# Patient Record
Sex: Female | Born: 1990 | Race: Black or African American | Hispanic: No | Marital: Single | Smoking: Never smoker
Health system: Southern US, Community
[De-identification: ages and names within clinical notes are randomized; demographics above are authoritative.]

## PROBLEM LIST (undated history)

## (undated) DIAGNOSIS — E079 Disorder of thyroid, unspecified: Secondary | ICD-10-CM

## (undated) DIAGNOSIS — F32A Depression, unspecified: Secondary | ICD-10-CM

## (undated) DIAGNOSIS — F419 Anxiety disorder, unspecified: Secondary | ICD-10-CM

## (undated) DIAGNOSIS — F1910 Other psychoactive substance abuse, uncomplicated: Secondary | ICD-10-CM

## (undated) DIAGNOSIS — F988 Other specified behavioral and emotional disorders with onset usually occurring in childhood and adolescence: Secondary | ICD-10-CM

## (undated) HISTORY — DX: Other specified behavioral and emotional disorders with onset usually occurring in childhood and adolescence: F98.8

## (undated) HISTORY — DX: Anxiety disorder, unspecified: F41.9

## (undated) HISTORY — DX: Disorder of thyroid, unspecified: E07.9

## (undated) HISTORY — DX: Depression, unspecified: F32.A

## (undated) HISTORY — DX: Other psychoactive substance abuse, uncomplicated: F19.10

## (undated) HISTORY — PX: NO PRIOR SURGERIES: 100

## (undated) MED ORDER — LEVOTHYROXINE SODIUM 75 MCG OR TABS
75.0000 ug | ORAL_TABLET | Freq: Every day | ORAL | 1 refills | Status: AC
Start: 2023-02-05 — End: ?

## (undated) MED ORDER — LEVOTHYROXINE SODIUM 50 MCG OR TABS
ORAL_TABLET | ORAL | 8 refills | Status: AC
Start: 2020-02-06 — End: ?

## (undated) MED ORDER — LEVOTHYROXINE SODIUM 50 MCG OR TABS
ORAL_TABLET | ORAL | 8 refills | Status: AC
Start: 2020-02-07 — End: ?

---

## 2008-04-02 ENCOUNTER — Other Ambulatory Visit: Admission: RE | Admit: 2008-04-02 | Discharge: 2008-04-02 | Payer: Self-pay | Admitting: Gynecology

## 2008-04-02 ENCOUNTER — Encounter: Payer: Self-pay | Admitting: Gynecology

## 2008-04-02 ENCOUNTER — Ambulatory Visit: Payer: Self-pay | Admitting: Gynecology

## 2009-09-05 ENCOUNTER — Emergency Department (HOSPITAL_COMMUNITY): Admission: EM | Admit: 2009-09-05 | Discharge: 2009-09-05 | Payer: Self-pay | Admitting: Emergency Medicine

## 2009-09-21 ENCOUNTER — Emergency Department (HOSPITAL_COMMUNITY): Admission: EM | Admit: 2009-09-21 | Discharge: 2009-09-22 | Payer: Self-pay | Admitting: Emergency Medicine

## 2009-09-27 ENCOUNTER — Emergency Department (HOSPITAL_COMMUNITY): Admission: EM | Admit: 2009-09-27 | Discharge: 2009-09-28 | Payer: Self-pay | Admitting: Emergency Medicine

## 2009-11-06 ENCOUNTER — Ambulatory Visit (HOSPITAL_COMMUNITY): Admission: RE | Admit: 2009-11-06 | Discharge: 2009-11-06 | Payer: Self-pay | Admitting: Obstetrics & Gynecology

## 2009-11-19 ENCOUNTER — Inpatient Hospital Stay (HOSPITAL_COMMUNITY): Admission: RE | Admit: 2009-11-19 | Discharge: 2009-11-20 | Payer: Self-pay | Admitting: Obstetrics & Gynecology

## 2009-11-20 ENCOUNTER — Encounter (INDEPENDENT_AMBULATORY_CARE_PROVIDER_SITE_OTHER): Payer: Self-pay | Admitting: Obstetrics & Gynecology

## 2009-11-24 ENCOUNTER — Ambulatory Visit: Payer: Self-pay | Admitting: Obstetrics and Gynecology

## 2009-11-24 ENCOUNTER — Encounter (INDEPENDENT_AMBULATORY_CARE_PROVIDER_SITE_OTHER): Payer: Self-pay | Admitting: Obstetrics and Gynecology

## 2009-11-24 ENCOUNTER — Inpatient Hospital Stay (HOSPITAL_COMMUNITY)
Admission: AD | Admit: 2009-11-24 | Discharge: 2009-11-25 | Payer: Self-pay | Source: Home / Self Care | Admitting: Obstetrics and Gynecology

## 2009-11-26 ENCOUNTER — Inpatient Hospital Stay (HOSPITAL_COMMUNITY)
Admission: AD | Admit: 2009-11-26 | Discharge: 2009-11-26 | Payer: Self-pay | Source: Home / Self Care | Admitting: Obstetrics & Gynecology

## 2009-11-26 DIAGNOSIS — N938 Other specified abnormal uterine and vaginal bleeding: Secondary | ICD-10-CM

## 2009-11-26 DIAGNOSIS — N949 Unspecified condition associated with female genital organs and menstrual cycle: Secondary | ICD-10-CM

## 2009-11-30 DEATH — deceased

## 2010-05-09 NOTE — Discharge Summary (Signed)
  NAMEVIVIA, ROSENBURG                ACCOUNT NO.:  [id number]  MEDICAL RECORD NO.:  [id number]          PATIENT TYPE:  INP  LOCATION:  9305                          FACILITY:  [facility]  PHYSICIAN:  Malva Limes, M.D.    DATE OF BIRTH:  1990/03/14  DATE OF ADMISSION:  11/24/2009 DATE OF DISCHARGE:  11/25/2009                              DISCHARGE SUMMARY   PRINCIPAL DISCHARGE DIAGNOSIS:  Abdominal pain.  HISTORY OF PRESENT ILLNESS:  Ms. Melyssa is a 20 year old female G1, P0-0- 1-0, status post elective pregnancy termination for a hydramnios 4 days prior to admission.  The patient presented to emergency room complaining of abdominal pain and severe abdominal cramping and bleeding.  The patient was felt to possibly have retained products of conception; therefore, she was taken to the operating room where D and C was performed.  The pathology revealed no evidence of retained products of conception.  The patient's white count on admission was 16. Postoperative day #1, the patient was doing well.  The white count had decreased to 12.  She had no complaint.  She was discharged to home. She was given Keflex 500 mg q.i.d. for 7 days.  She was told to return to the office in 1 week.          ______________________________ Malva Limes, M.D.     MA/MEDQ  D:  05/05/2010  T:  05/06/2010  Job:  [id number]  Electronically Signed by Malva Limes M.D. on 05/08/2010 09:45:16 AM

## 2010-05-15 LAB — CBC
HCT: 36.1 % (ref 36.0–46.0)
HCT: 36.9 % (ref 36.0–46.0)
Hemoglobin: 12.1 g/dL (ref 12.0–15.0)
MCH: 29.8 pg (ref 26.0–34.0)
MCH: 30 pg (ref 26.0–34.0)
MCH: 30.2 pg (ref 26.0–34.0)
MCHC: 33.3 g/dL (ref 30.0–36.0)
MCV: 88.5 fL (ref 78.0–100.0)
MCV: 89.2 fL (ref 78.0–100.0)
Platelets: 168 10*3/uL (ref 150–400)
Platelets: 185 10*3/uL (ref 150–400)
Platelets: 216 10*3/uL (ref 150–400)
Platelets: 241 10*3/uL (ref 150–400)
RBC: 3.86 MIL/uL — ABNORMAL LOW (ref 3.87–5.11)
RBC: 4.02 MIL/uL (ref 3.87–5.11)
RDW: 13 % (ref 11.5–15.5)
WBC: 11 10*3/uL — ABNORMAL HIGH (ref 4.0–10.5)
WBC: 12.3 10*3/uL — ABNORMAL HIGH (ref 4.0–10.5)
WBC: 16.1 10*3/uL — ABNORMAL HIGH (ref 4.0–10.5)

## 2010-05-15 LAB — DIFFERENTIAL
Basophils Relative: 0 % (ref 0–1)
Monocytes Absolute: 0.4 10*3/uL (ref 0.1–1.0)
Neutro Abs: 8.4 10*3/uL — ABNORMAL HIGH (ref 1.7–7.7)
Neutrophils Relative %: 76 % (ref 43–77)

## 2010-05-15 LAB — RPR: RPR Ser Ql: NONREACTIVE

## 2010-05-17 LAB — COMPREHENSIVE METABOLIC PANEL
AST: 12 U/L (ref 0–37)
AST: 15 U/L (ref 0–37)
Alkaline Phosphatase: 39 U/L (ref 39–117)
Calcium: 9.4 mg/dL (ref 8.4–10.5)
Chloride: 105 mEq/L (ref 96–112)
Creatinine, Ser: 0.67 mg/dL (ref 0.4–1.2)
Creatinine, Ser: 0.67 mg/dL (ref 0.4–1.2)
GFR calc non Af Amer: >60 mL/min (ref >60)
Sodium: 135 mEq/L (ref 135–145)
Total Protein: 7.3 g/dL (ref 6.0–8.3)
Total Protein: 7.4 g/dL (ref 6.0–8.3)

## 2010-05-17 LAB — URINALYSIS, ROUTINE W REFLEX MICROSCOPIC
Bilirubin Urine: NEGATIVE
Glucose, UA: NEGATIVE mg/dL
Glucose, UA: NEGATIVE mg/dL
Hgb urine dipstick: NEGATIVE
Ketones, ur: NEGATIVE mg/dL
Nitrite: NEGATIVE
Nitrite: NEGATIVE
Protein, ur: NEGATIVE mg/dL
Specific Gravity, Urine: 1.012 (ref 1.005–1.030)
Urobilinogen, UA: 0.2 mg/dL (ref 0.0–1.0)
pH: 6 (ref 5.0–8.0)

## 2010-05-17 LAB — CBC
HCT: 35.3 % — ABNORMAL LOW (ref 36.0–46.0)
MCHC: 33.7 g/dL (ref 30.0–36.0)
MCHC: 34.5 g/dL (ref 30.0–36.0)
MCV: 86.1 fL (ref 78.0–100.0)
Platelets: 216 10*3/uL (ref 150–400)
RBC: 4.22 MIL/uL (ref 3.87–5.11)
RDW: 14.6 % (ref 11.5–15.5)
WBC: 15.2 10*3/uL — ABNORMAL HIGH (ref 4.0–10.5)

## 2010-05-17 LAB — DIFFERENTIAL
Basophils Relative: 0 % (ref 0–1)
Basophils Relative: 0 % (ref 0–1)
Eosinophils Absolute: 0.1 10*3/uL (ref 0.0–0.7)
Eosinophils Absolute: 0 10*3/uL (ref 0.0–0.7)
Lymphocytes Relative: 15 % (ref 12–46)
Lymphs Abs: 2.1 10*3/uL (ref 0.7–4.0)
Lymphs Abs: 2.4 10*3/uL (ref 0.7–4.0)
Monocytes Absolute: 0.6 10*3/uL (ref 0.1–1.0)
Monocytes Relative: 5 % (ref 3–12)
Monocytes Relative: 4 % (ref 3–12)
Neutro Abs: 12.1 10*3/uL — ABNORMAL HIGH (ref 1.7–7.7)

## 2010-05-17 LAB — LIPASE, BLOOD
Lipase: 26 U/L (ref 11–59)
Lipase: 31 U/L (ref 11–59)

## 2010-05-17 LAB — HCG, QUANTITATIVE, PREGNANCY: hCG, Beta Chain, Quant, S: 49914 m[IU]/mL — ABNORMAL HIGH (ref <5)

## 2010-05-18 LAB — URINALYSIS, ROUTINE W REFLEX MICROSCOPIC
Hgb urine dipstick: NEGATIVE
Ketones, ur: 15 mg/dL — AB
Urobilinogen, UA: 1 mg/dL (ref 0.0–1.0)

## 2010-05-18 LAB — URINE MICROSCOPIC-ADD ON

## 2011-05-02 IMAGING — US US ABDOMEN COMPLETE
1 series · 14 of 25 positions shown · non-contrast
Comparison: None

CLINICAL DATA: Abdominal pain and vomiting.

ABDOMINAL ULTRASOUND COMPLETE

[Series 1: us abdomen complete · 0.32mm/px · 14 of 45 slices shown]
[im 1/45]
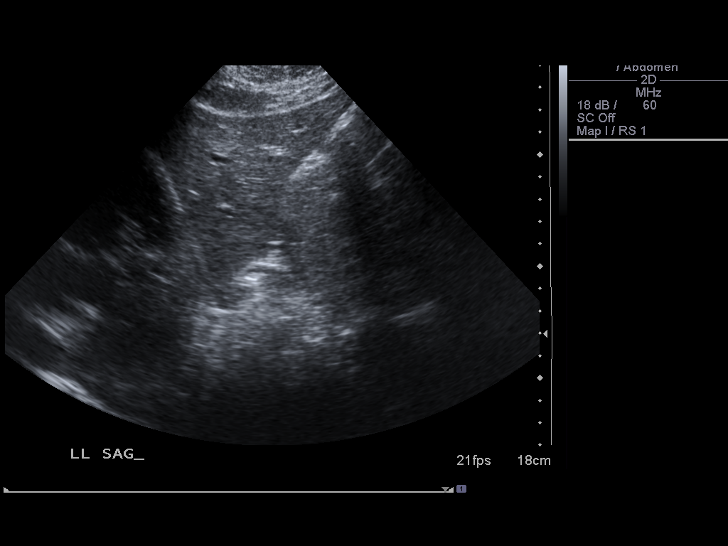
[im 4/45]
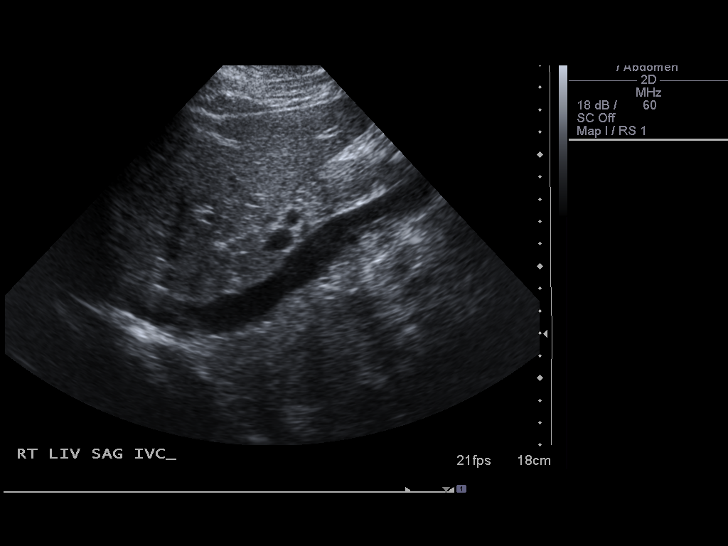
[im 8/45]
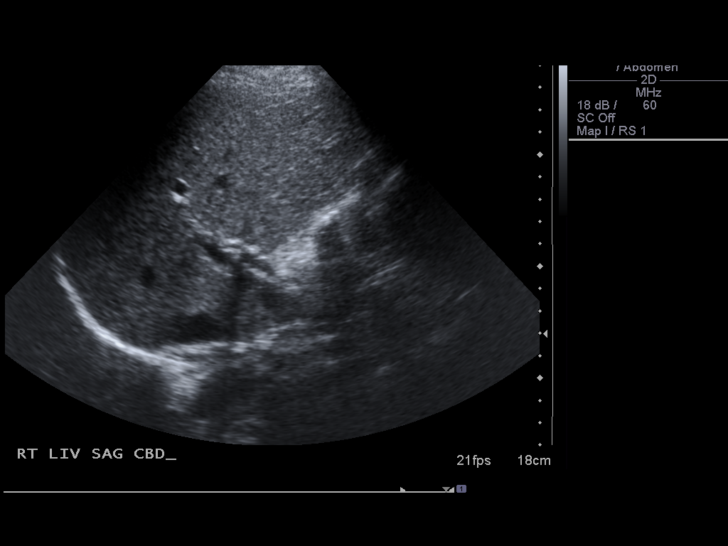
[im 12/45]
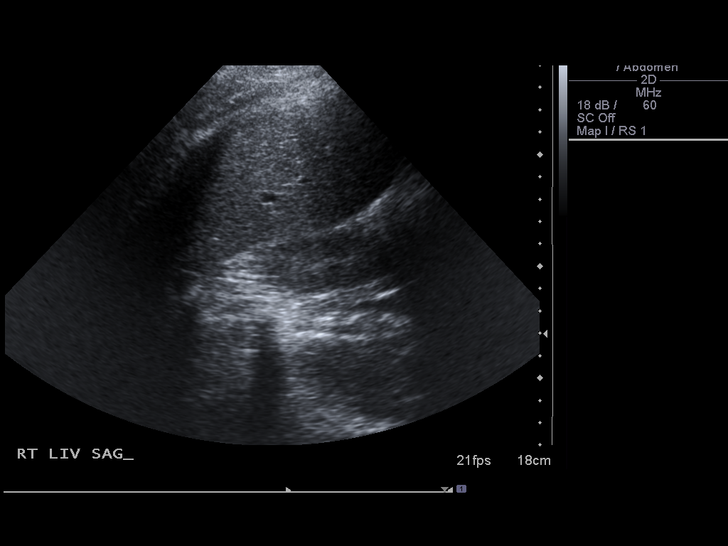
[im 15/45]
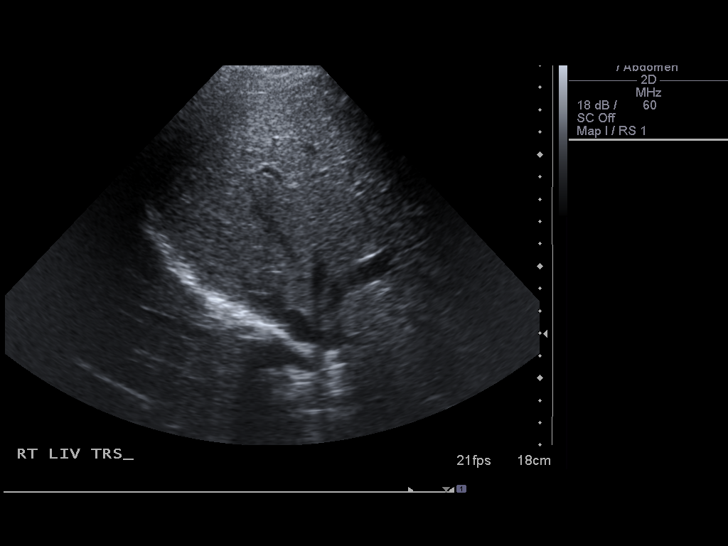
[im 17/45]
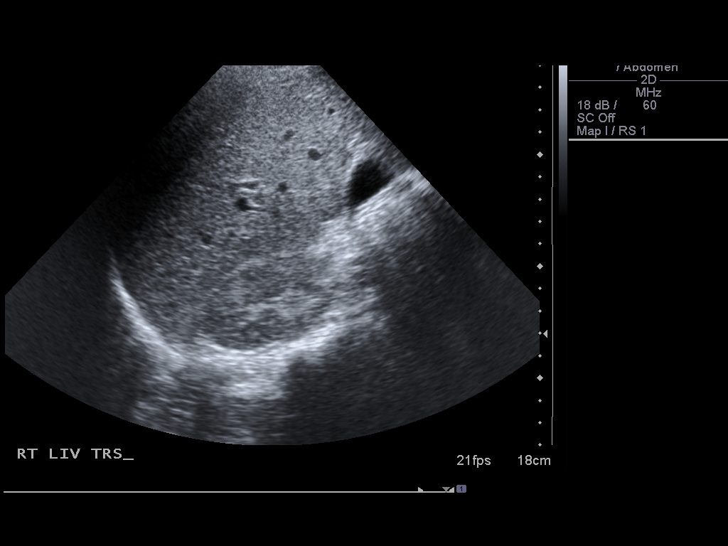
[im 21/45]
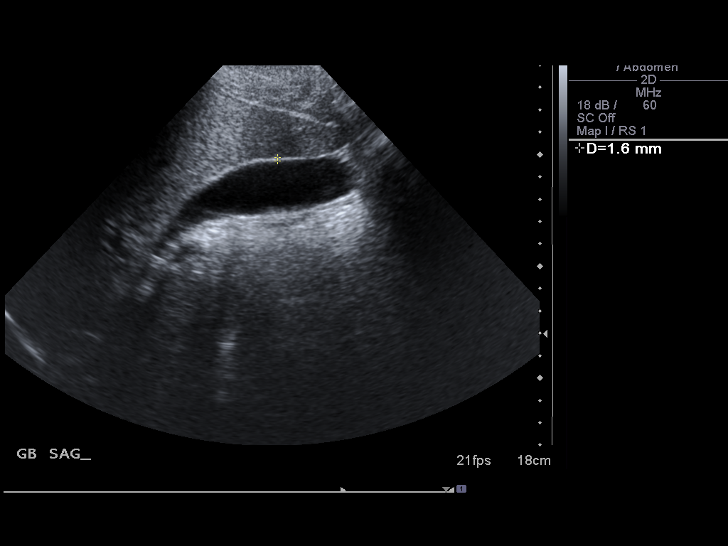
[im 24/45]
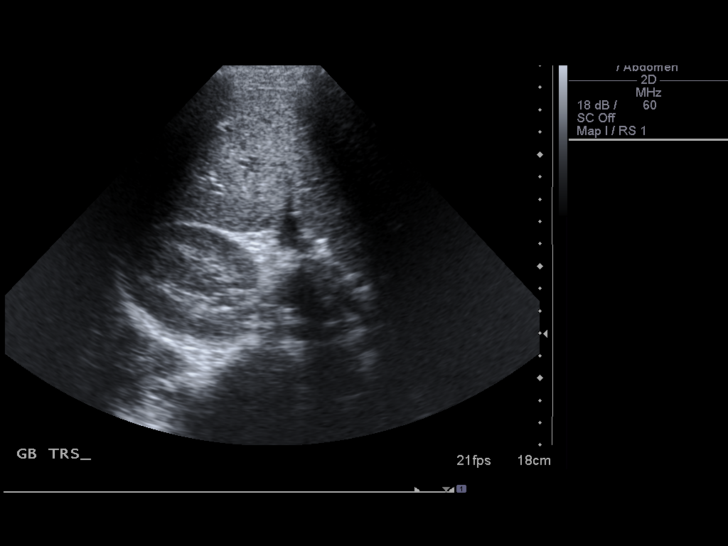
[im 28/45]
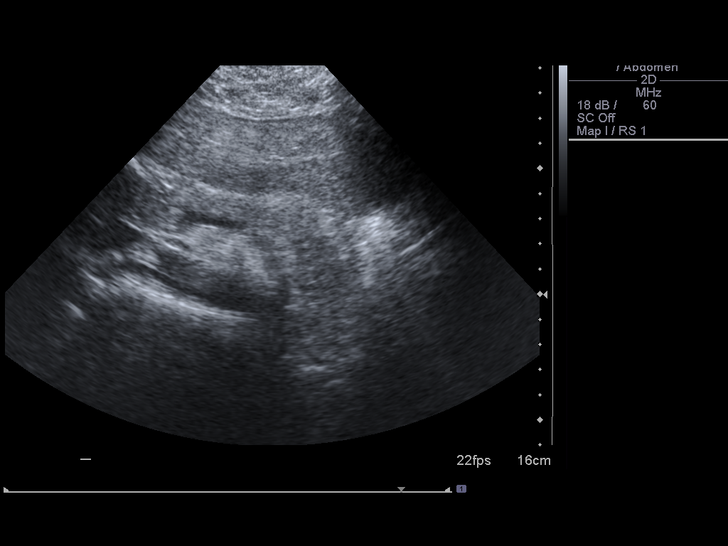
[im 30/45]
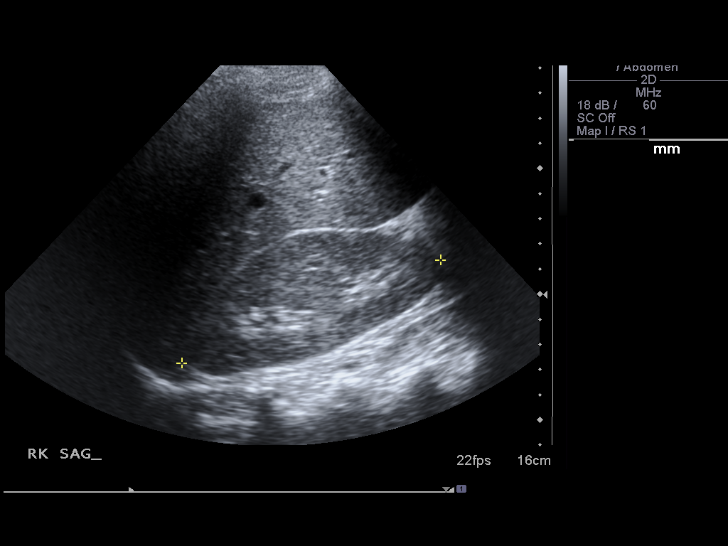
[im 34/45]
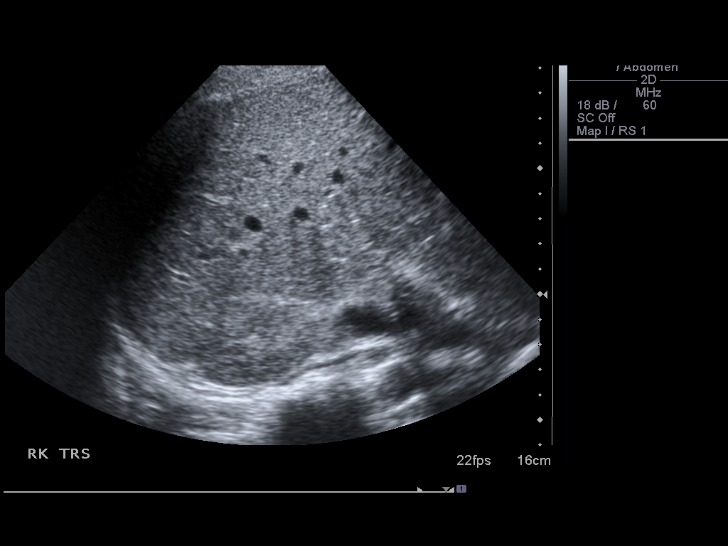
[im 37/45]
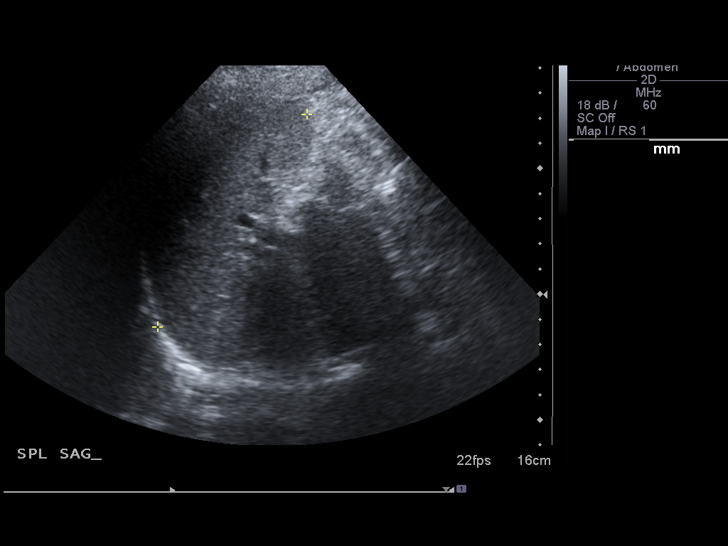
[im 41/45]
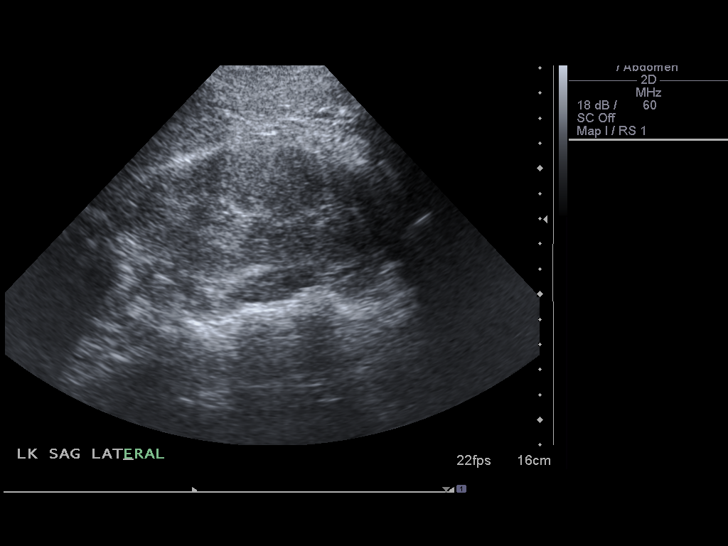
[im 45/45]
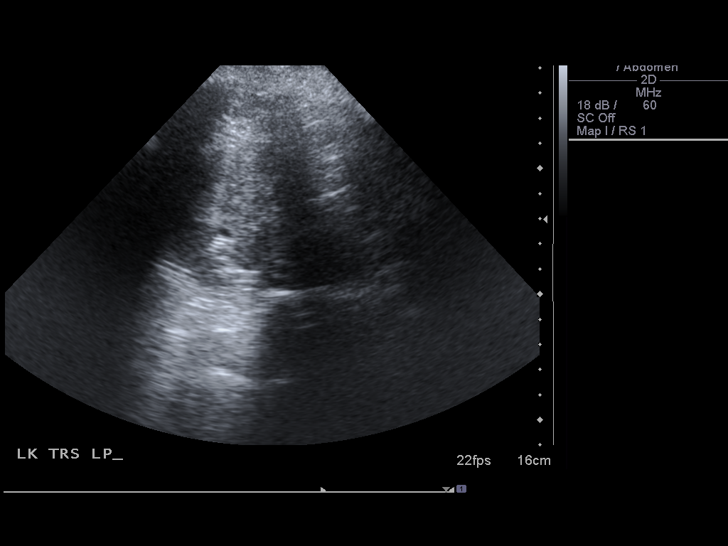

[14 of 25 positions shown; findings below may reference images not displayed]

FINDINGS: Gallbladder:  The gallbladder is normal in appearance, without
evidence for gallstones, gallbladder wall thickening or
pericholecystic fluid. No ultrasonographic Murphy's sign is
elicited.

Common Bile Duct:  0.5 cm in diameter; within normal limits in
caliber.

Liver:  Normal parenchymal echogenicity and echotexture; no focal
lesions identified.  Limited Doppler evaluation demonstrates normal
blood flow within the liver.

IVC:  Unremarkable in appearance.

Pancreas:  Although the pancreas is difficult to visualize in its
entirety due to overlying bowel gas, no focal pancreatic
abnormality is identified.

Spleen:  10.3 cm in length; within normal limits in size and
echotexture.

Right kidney:  11.1 cm in length; normal in size, configuration and
parenchymal echogenicity.  No evidence of mass or hydronephrosis.

Left kidney:  11.9 cm in length; normal in size, configuration and
parenchymal echogenicity.  No evidence of mass or hydronephrosis.

Abdominal Aorta:  Normal in caliber; no aneurysm identified.
IMPRESSION: Unremarkable abdominal ultrasound.

## 2012-09-14 ENCOUNTER — Ambulatory Visit: Payer: Self-pay | Admitting: *Deleted

## 2017-09-15 ENCOUNTER — Ambulatory Visit (INDEPENDENT_AMBULATORY_CARE_PROVIDER_SITE_OTHER): Payer: PRIVATE HEALTH INSURANCE | Admitting: Family Medicine

## 2017-09-15 ENCOUNTER — Encounter (INDEPENDENT_AMBULATORY_CARE_PROVIDER_SITE_OTHER): Payer: Self-pay | Admitting: Family Medicine

## 2017-09-15 VITALS — BP 121/70 | HR 66 | Temp 99.5°F | Ht 60.24 in | Wt 140.2 lb

## 2017-09-15 DIAGNOSIS — E063 Autoimmune thyroiditis: Secondary | ICD-10-CM

## 2017-09-15 DIAGNOSIS — E038 Other specified hypothyroidism: Secondary | ICD-10-CM

## 2017-09-15 DIAGNOSIS — F3289 Other specified depressive episodes: Secondary | ICD-10-CM

## 2017-09-15 DIAGNOSIS — F900 Attention-deficit hyperactivity disorder, predominantly inattentive type: Secondary | ICD-10-CM

## 2017-09-15 DIAGNOSIS — J358 Other chronic diseases of tonsils and adenoids: Secondary | ICD-10-CM

## 2017-09-15 DIAGNOSIS — Z6827 Body mass index (BMI) 27.0-27.9, adult: Secondary | ICD-10-CM

## 2017-09-15 DIAGNOSIS — R197 Diarrhea, unspecified: Secondary | ICD-10-CM

## 2017-09-15 NOTE — Progress Notes (Addendum)
CC:  Chief Complaint   Patient presents with    Establish Care     welcome to [facility], just moved in 11/19  needs PCP    Referral     endocrinology Dr. Jacqualine Mauylee, Psych       HPI  Meagan Coffey is a 27 year old female who presents to the office today to establish care.  She recently moved to [place] from [place] here 11/18.    -she was diagnosed with Hypothyroidism last year while in [place].  She is not sure if her levels are stable-feels hot flushes at times.  Considering a endocrine evaluation.  TPO antibiotic have been very elevated in the past.      -diagnosed with ADD about 2 years ago.  She has been taking concerta ever since.  She wa having trouble with concentration and task managements.  She wonders if perhaps she was affected by her thyroid issues.  She is not taking it as much any more.  Feels much better since taking Cymbalta.  Would like to have a formal evaluation.  Loosing things and disorganized.  Doing much better    -Patient started Cymbalta and was started a few months ago.  Was evaluated by a physician in [place] and was told that maybe there was a component of depression as well.  She is taking it and is helping.  Denies suicidal.  Would like to continue the medication for now.    -tonsil stones: for about 3 years.  Doing irrigations but recurrent and has recurrent infections  ENT told her before that she could have surgery for this and she is interested.    -diarrhea, loose stool with morning BM. No abd pain and no blood in the stool.  Since about last august.  Always loose in the morning.  Concerned about gluten intolerance.  She is better when in a gluten free diet and in a low FODMAP diet.      Past Medical History  No past medical history on file.    Past Surgical History  No past surgical history on file.    Allergies  Review of patient's allergies indicates:  Allergies   Allergen Reactions    Penicillins Fever, Rash and Diarrhea/GI       Medications  Current Outpatient Medications   Medication  Sig Dispense Refill    DULoxetine (CYMBALTA) 20 MG Oral CAPSULE ENTERIC COATED PARTICLES Take 20 mg by mouth daily.      Levothyroxine Sodium 125 MCG Oral Cap Take 125 mcg by mouth daily on an empty stomach.      methylphenidate ER (CONCERTA) 36 MG Oral Tab CR Take 36 mg by mouth.       No current facility-administered medications for this visit.        Family History  Family History     Problem (# of Occurrences) Relation (Name,Age of Onset)    Cancer (1) Mother    Depression (1) Sister    Lung Cancer (1) Mother (5644)    Lupus (1) Maternal Aunt            Social History     Tobacco Use    Smoking status: Former Smoker     Last attempt to quit: 10/16/2013     Years since quitting: 3.9   Substance Use Topics    Alcohol use: Yes     Frequency: Monthly or less     Comment: occ    Drug use: Not on file  ROS:  Constitutional: Denies fatigue or WT loss  ENT: as above  CARDIOVASCULAR:Denies chest pain, shortness of breath, sometime palpitations, no orthopnea, no syncopal episodes, no dizziness or bad headaches  RESPIRATORY: Denies SHORTNESS OF BREATH, no wheezing, no chronic cough, no pleuritic chest pain  GI:  diarrhea per HPI. No abdominal pain.  GU: no dysuria, no polyuria, no hematuria, no tenesmus  SKIN: no new or worsening rash  Neuro:no weakness, no numbness, no HA, confusion, no visual changes.      Physical Exam  BP 121/70    Pulse 66    Temp 99.5 F (37.5 C) (Temporal)    Ht 5' 0.24" (1.53 m)    Wt 140 lb 3.2 oz (63.6 kg)    LMP 09/12/2017    SpO2 98%    BMI 27.17 kg/m   PHYSICAL EXAM:  General: healthy, alert, relaxed, cooperative, smiling  Skin: Skin color, texture, turgor normal. No rashes or concerning lesions  Head: Normocephalic. No masses, lesions, tenderness or abnormalities  Ears: External ears normal. Canals clear. TM's normal.  Nose:normal, nares patent  Oropharynx: Lips, mucosa, and tongue normal. Teeth and gums normal., posterior pharynx without erythema or drainage, there are large  criptae on residual tonsilar tissue, R>L  Neck: supple. No adenopathy. Thyroid symmetric, normal size, without nodules  Lungs: clear to auscultation  Heart: normal rate, regular rhythm and no murmurs, clicks, or gallops  Psych: normal and congruent mood and affect, normal speech and behavior, good judgement and insight    Assessment/Plan    (E03.8,  E06.3) Hypothyroidism due to Hashimoto's thyroiditis  (primary encounter diagnosis)  Plan: Thyroid Stimulating Hormone, T3, Free T4      Let's check levels and adjust the medication as needed.  Consider endocrine referral if needed.    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   will get ROI signed and review records.    Diagnosis and treatment options have been explained to the patient including medications side effects.  Questions have been addressed.  Patient verbalized understanding and agrees with the plan outlined today. Patient will return to medical attention if symptoms persist or worsen. Patient advised to read the information that will be provided by his pharmacist and to call if questions.  AVS was reviewed with patient.     Elsie Saas, MD  [facility]  [facility]    Notes on Charting and calculation of WUJ:WJXBJYN of coordination of care may appear as preceding or following telephone encounters, or notes in order details sections of orders, or in notes attached to labs ordered incident to this visit.

## 2017-09-15 NOTE — Patient Instructions (Signed)
Meagan Coffey  It was a pleasure to meet you today. Thank you for choosing [facility] at [facility].    If lab tests were ordered today, we will send you a message as the results arrive.  If you have any concerns or questions after the visit or when reviewing your results, please do not hesitate to send Meagan Coffey an e-message or give Meagan Coffey a call.    If medications were prescribed, please read the information your pharmacist will provide you about the medication, its indications and potential side effects.  Please ask any questions you may have before taking the medication.       Please make the appointment with the specialist as discussed.  Come to the lab tomorrow or next week while eating gluten.  Schedule an annual exam when you are able.

## 2017-09-16 ENCOUNTER — Encounter (INDEPENDENT_AMBULATORY_CARE_PROVIDER_SITE_OTHER): Payer: Self-pay | Admitting: Family Medicine

## 2017-09-16 NOTE — Telephone Encounter (Signed)
ROI Received on: 09/15/2017     [facility]    Records needed: Complete coy of legal    Request was sent via: Fax; faxed to [phone]    Contact number for follow up:

## 2017-09-16 NOTE — Telephone Encounter (Signed)
ROI Received on: 09/15/2017     [facility]    Records needed: Hashimoto's // labs + notes    Request was sent via: Fax; faxed to [phone]    Contact number for follow up: [phone]

## 2017-09-20 ENCOUNTER — Other Ambulatory Visit (INDEPENDENT_AMBULATORY_CARE_PROVIDER_SITE_OTHER): Payer: Self-pay | Admitting: Family Medicine

## 2017-09-20 DIAGNOSIS — F900 Attention-deficit hyperactivity disorder, predominantly inattentive type: Secondary | ICD-10-CM

## 2017-09-20 DIAGNOSIS — R197 Diarrhea, unspecified: Secondary | ICD-10-CM

## 2017-09-20 LAB — T4, FREE: Thyroxine (Free): 0.9 ng/dL (ref 0.6–1.2)

## 2017-09-20 LAB — BASIC METABOLIC PANEL
Anion Gap: 9 (ref 4–12)
Calcium: 9.8 mg/dL (ref 8.9–10.2)
Carbon Dioxide, Total: 28 meq/L (ref 22–32)
Chloride: 103 meq/L (ref 98–108)
Creatinine: 0.52 mg/dL (ref 0.38–1.02)
GFR, Calc, African American: >60 mL/min/{1.73_m2} (ref >59)
GFR, Calc, European American: >60 mL/min/{1.73_m2} (ref >59)
Glucose: 78 mg/dL (ref 62–125)
Potassium: 4.4 meq/L (ref 3.6–5.2)
Sodium: 140 meq/L (ref 135–145)
Urea Nitrogen: 6 mg/dL — ABNORMAL LOW (ref 8–21)

## 2017-09-20 LAB — THYROID STIMULATING HORMONE: Thyroid Stimulating Hormone: 2.141 u[IU]/mL (ref 0.400–5.000)

## 2017-09-20 LAB — T3: Triiodothyronine (T3): 105 ng/dL (ref 73–178)

## 2017-09-21 ENCOUNTER — Telehealth (INDEPENDENT_AMBULATORY_CARE_PROVIDER_SITE_OTHER): Payer: Self-pay

## 2017-09-21 LAB — CELIAC SEROLOGY REFLEX PANEL
Anti Deaminated Gliadin, IgG: <1 U (ref 0–13)
Anti tTransglutaminase, IgA: <1 U (ref 0–13)

## 2017-09-21 NOTE — Result Encounter Note (Signed)
Results reviewed and released to patient through ecare with a brief explanation.

## 2017-09-21 NOTE — Telephone Encounter (Signed)
Per Dr.. Henkle's request, I sent pt an ecare msg asking if she is willing to be seen at [facility].

## 2017-09-22 ENCOUNTER — Encounter (INDEPENDENT_AMBULATORY_CARE_PROVIDER_SITE_OTHER): Payer: Self-pay | Admitting: Family Medicine

## 2017-09-22 DIAGNOSIS — R1084 Generalized abdominal pain: Secondary | ICD-10-CM

## 2017-09-22 DIAGNOSIS — R197 Diarrhea, unspecified: Secondary | ICD-10-CM

## 2017-09-22 NOTE — Telephone Encounter (Signed)
Routing to provider.     Patient symptoms are still present and becoming more severe.     Patient is wondering what next step is. See a GI specialist?    Would like to get started on next step.    Please review and advise. Thank you.

## 2017-09-24 ENCOUNTER — Other Ambulatory Visit (INDEPENDENT_AMBULATORY_CARE_PROVIDER_SITE_OTHER): Payer: Self-pay | Admitting: Family Medicine

## 2017-09-24 DIAGNOSIS — F988 Other specified behavioral and emotional disorders with onset usually occurring in childhood and adolescence: Secondary | ICD-10-CM

## 2017-09-24 NOTE — Progress Notes (Signed)
HN at [facility] received a referral to psychiatry for patient to see Fonnie MuKerry Meyer for ADD/ADHD. This is not the correct referral type.    Referral needs to bo To ADD/ADHD ADULTS. Pending correct referral and routing to PCP and HN at [facility]. PCP to review/sign correct order.

## 2017-09-24 NOTE — Addendum Note (Signed)
Addended by: Karolee StampsSCORDAMAGLIA, Jamilette Suchocki MARIA on: 09/24/2017 03:55 PM     Modules accepted: Orders

## 2017-09-24 NOTE — Addendum Note (Signed)
Addended by: Karolee StampsSCORDAMAGLIA, Zadyn Yardley MARIA on: 09/24/2017 03:56 PM     Modules accepted: Orders

## 2017-09-24 NOTE — Telephone Encounter (Addendum)
Opened in error

## 2017-09-25 NOTE — Telephone Encounter (Addendum)
Yes, please.  I would like to review these records.

## 2017-09-25 NOTE — Telephone Encounter (Signed)
Records received on 09/25/2017 from [facility]. Records are being held in the medical records drawer.    Dr. Gerilyn NestleHenkle - Please let PSR know, via addendum to this encounter under the contact log tab, if you would like to review these records.    Patient has an appointment on: 09/29/2017    Data entered/Scanned records:  SCANNED: Pap: NO           Mammo: NO                      Colonoscopy: NO           Diabetic Eye Exam: NO  DATA ENTERED (by lab):            INR: NO                      LDL: YES                      A1C: YES  IMMUNIZATIONS: No Immunizations in records    Records to be shredded: 03/02/2018

## 2017-09-25 NOTE — Progress Notes (Signed)
Done.  Thank you very much for your assistance.  EH

## 2017-09-29 ENCOUNTER — Ambulatory Visit (INDEPENDENT_AMBULATORY_CARE_PROVIDER_SITE_OTHER): Payer: PRIVATE HEALTH INSURANCE | Admitting: Family Medicine

## 2017-09-29 ENCOUNTER — Encounter (INDEPENDENT_AMBULATORY_CARE_PROVIDER_SITE_OTHER): Payer: Self-pay | Admitting: Family Medicine

## 2017-09-29 VITALS — BP 93/62 | HR 77 | Temp 99.2°F | Ht 61.02 in | Wt 140.0 lb

## 2017-09-29 DIAGNOSIS — Z6826 Body mass index (BMI) 26.0-26.9, adult: Secondary | ICD-10-CM

## 2017-09-29 DIAGNOSIS — K591 Functional diarrhea: Secondary | ICD-10-CM

## 2017-09-29 DIAGNOSIS — E038 Other specified hypothyroidism: Secondary | ICD-10-CM

## 2017-09-29 DIAGNOSIS — L659 Nonscarring hair loss, unspecified: Secondary | ICD-10-CM

## 2017-09-29 DIAGNOSIS — E063 Autoimmune thyroiditis: Secondary | ICD-10-CM

## 2017-09-29 DIAGNOSIS — Z0000 Encounter for general adult medical examination without abnormal findings: Secondary | ICD-10-CM

## 2017-09-29 LAB — CBC, DIFF
% Basophils: 0 %
% Eosinophils: 3 %
% Immature Granulocytes: 0 %
% Lymphocytes: 33 %
% Monocytes: 6 %
% Neutrophils: 58 %
% Nucleated RBC: 0 %
Absolute Eosinophil Count: 0.16 10*3/uL (ref 0.00–0.50)
Absolute Lymphocyte Count: 1.64 10*3/uL (ref 1.00–4.80)
Basophils: 0.02 10*3/uL (ref 0.00–0.20)
Hematocrit: 43 % (ref 36–45)
Hemoglobin: 13.8 g/dL (ref 11.5–15.5)
Immature Granulocytes: 0 10*3/uL (ref 0.00–0.05)
MCH: 28.7 pg (ref 27.3–33.6)
MCHC: 32.1 g/dL — ABNORMAL LOW (ref 32.2–36.5)
MCV: 89 fL (ref 81–98)
Monocytes: 0.3 10*3/uL (ref 0.00–0.80)
Neutrophils: 2.87 10*3/uL (ref 1.80–7.00)
Nucleated RBC: 0 10*3/uL
Platelet Count: 267 10*3/uL (ref 150–400)
RBC: 4.81 10*6/uL (ref 3.80–5.00)
RDW-CV: 13.1 % (ref 11.6–14.4)
WBC: 4.99 10*3/uL (ref 4.3–10.0)

## 2017-09-29 LAB — HEMOGLOBIN A1C, RAPID: Hemoglobin A1C: 5.1 % (ref 4.0–6.0)

## 2017-09-29 LAB — LIPID PANEL
Cholesterol (LDL): 120 mg/dL (ref <130)
Cholesterol/HDL Ratio: 2.9
HDL Cholesterol: 70 mg/dL (ref >39)
Non-HDL Cholesterol: 134 mg/dL (ref 0–159)
Total Cholesterol: 204 mg/dL — ABNORMAL HIGH (ref <200)
Triglyceride: 69 mg/dL (ref <150)

## 2017-09-29 LAB — GLUCOSE, FASTING: Glucose, Fasting: 82 mg/dL (ref 62–125)

## 2017-09-29 MED ORDER — LEVOTHYROXINE SODIUM 50 MCG OR TABS
50.0000 ug | ORAL_TABLET | Freq: Every day | ORAL | 1 refills | Status: DC
Start: 2017-09-29 — End: 2017-12-31

## 2017-09-29 NOTE — Progress Notes (Signed)
Meagan Coffey is a 27 year old female here today for a preventive health visit.   Other problems or concerns today:     -continues to have loose stools on and off. No blood and no mucus.  Her lower abd discomfort is better after BM.    -hair thinning and hair loss-not sure why but started after taking Levothyroxine. Recent levels reviewed with Patient and normal.  I suggested to follow up on TPO antibiotic since it has been a year    -Patient is not due for pap and declines breast exam today    PHQ2 Total Score: 0      GYN HISTORY  OB History   No data available       Currently having periods: YES  Patient's last menstrual period was 09/12/2017.   Periods are irregular, lasting 5 days.    Crampy, lower abdominal pain during periods: mild, occurring first 1-2 days of flow  Cyclic symptoms: none  Intermenstrual bleeding, spotting,or discharge: No   Last pap: 2018  Pap history: no history of abnormal paps  Other gyn history: none    SEXUAL HISTORY  Sexual activity: yes, single partner, contraception - IUD  Age at first intercourse: Not asked today  History of STDs: none known  Number of sex partners in the past year: one  Last STD check: N/A  New partner(s) since last STD check: No    Sexual concerns: No    History of sexual or physical abuse: No  History of any other forms of abuse (e.g. verbal, financial): No  Has the patient been hit, kicked, punched, or otherwise hurt by someone within the past year: No    CANCER SCREENING  Family history of colon cancer: NO  Family history of uterine or ovarian cancer: NO  Family history of breast cancer: NO  Prior mammogram: NO  History of abnormal mammogram: NO    LIFESTYLE  Current dietary habits: healthy diet in general  well balanced  Calcium: dietary sources only  Current exercise habits: yes, engages in regular exercise  Regular seat belt use: YES  Substance use:  reports that she quit smoking about 3 years ago. She does not have any smokeless tobacco history on file.  She reports that she drinks alcohol.  Exposure to hazardous materials: No  Guns in the house: No    Review Of Systems  Regular dental exams: yes  Constitutional: Denies fatigue fever chills but has been tired  Respiratory: Denies dyspnea on exertion dyspnea at rest cough wheezing   Cardiovascular: Denies any cardiovascular problems   GI: Denies nausea vomiting   Neurologic: Denies dizziness syncope focal weakness   Psych: any psychological problems has been struggling with low stamina and discouragement but has been improved with Cymbalta.  Pending evaluation for ADD    EXAM:  BP 93/62    Pulse 77    Temp 99.2 F (37.3 C)    Ht 5' 1.02" (1.55 m)    Wt 140 lb (63.5 kg)    LMP 09/12/2017    SpO2 98%    BMI 26.43 kg/m   Body mass index is 26.43 kg/m.  General: healthy, alert, no distress  Head: Normocephalic. No masses, lesions, tenderness or abnormalities  supple. No adenopathy. Thyroid symmetric, normal size, without nodules  Lungs: CTA bilaterally  Heart: normal rate, regular rhythm  Skin: Skin color, texture, turgor normal. No rashes or concerning lesions  Abdomen: soft, slightly tender on the lower quadrants. BS normal. No  masses or organomegaly  Neuro:  Grossly normal to observation, gait normal  Breasts: not done: discussed recommendations for age, patient does BSE, patient declined  Pelvic exam: deferred    ASSESSMENT/PLAN:    (Z00.00) Encounter for general adult medical examination without abnormal findings  (primary encounter diagnosis)  Plan: CBC with Differential, Lipid Panel, HEMOGLOBIN         A1C, RAPID, Glucose, Fasting             (E03.8,  E06.3) Hypothyroidism due to Hashimoto's thyroiditis  Plan: Levothyroxine Sodium 50 MCG Oral Tab, Anti         Thyroid Peroxidase       (L65.9) Hair thinning  Plan: consider Biotin/collagen. Suspecting side effect form Levothyroxine.    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  Will add probiotics and soluble fiber.  Consider GI referral If not feeling better  Follow up in 3 month or PRN        Health Maintenance   Topic Date Due    HIV Screening  11/12/2003    Tetanus Vaccine  11/12/2007    Cervical Cancer Screening  11/11/2009    Influenza Vaccine (1) 11/30/2017    Depression Screening (PHQ-2)  09/30/2018    Pneumococcal Vaccine: Pediatrics (0-5 years) and At-Risk Patients (6-64 years)  Aged Out       Immunizations or studies due: none - up to date  Chlamydia screening offered: N/A  HIV screening offered: N/A  HPV vaccine offered: No    Preventive counseling: health maintenance, importance of rest and outdoor activities as well as staying active during the day introducing intervals of activity and avoiding sitting for long periods of time.  breast self-exam  dental care  Follow-up: 1 year    Diagnosis and treatment options have been explained to the patient including medications side effects.  Questions have been addressed.  Patient verbalized understanding and agrees with the plan outlined today. Patient will return to medical attention if symptoms persist or worsen. Patient advised to read the information that will be provided by his pharmacist and to call if questions.  AVS was reviewed with patient.     Meagan SaasEsther Lilyth Lawyer, MD  [facility]  [facility]    Notes on Charting and calculation of NWG:NFAOZHYLOS:details of coordination of care may appear as preceding or following telephone encounters, or notes in order details sections of orders, or in notes attached to labs ordered incident to this visit.

## 2017-09-29 NOTE — Patient Instructions (Addendum)
Recommendations for Pap Smear screening age 30-29      No history of abnormal paps:  Pap smear every three years.     If you are sexually active, you should have a yearly screening for Chlamydia, HIV, and other sexually transmitted infections as appropriate   If you are using contraception, you should see your provider yearly for symptom review, blood pressure check (if using a hormonal method), and exam if needed.      Patient Education     Prevention Guidelines,Women Ages 24 to 34  Screening tests and vaccines are an important part of managing your health. A screening test is done to find possible disorders or diseases in people who don't have any symptoms. The goal is to find a disease early so lifestyle changes can be made and you can be watched more closely to reduce the risk of disease, or to detect it early enough to treat it most effectively. Screening tests are not considered diagnostic, but are used to determine if more testing is needed. Health counseling is essential, too. Below are guidelines for these, for women ages 40 to 70. Talk with your healthcare provider to make sure youre up-to-date on what you need.  Screening Who needs it How often   Alcohol misuse All women in this age group At routine exams   Blood pressure All women in this age group Yearly checkup if your blood pressure is normal  Normal blood pressure is less than 120/80 mm Hg  If your blood pressure reading is higher than normal, follow the advice of your healthcare provider   Breast cancer All women in this age group should talk with their healthcare providers about the need for clinical breast exams (CBE)1 Clinical breast exam every 3 years1   Cervical cancer Women ages 68 and older Women between ages 81 and 29 should have a Pap test every 3 years; women between ages 71 and 76 are advised to have a Pap test plus an HPV test every 5 years   Chlamydia Sexually active women ages 73 and younger, and women at increased risk  for infection (such as having multiple sex partners) Every year if you're at risk or have symptoms   Depression All women in this age group At routine exams   Type 2 diabetes, prediabetes All women with no symptoms who are overweight or obese and have 1 or more other risk factors for diabetes At least every 3 years. Also, testing for diabetes during pregnancy after the 24th week.   Type 2 diabetes, prediabetes All women diagnosed with gestational diabetes Lifelong testing every 3 years   Type 2 diabetes All women with prediabetes Every year   Gonorrhea Sexually active women at increased risk for infection At routine exams   Hepatitis C Anyone at increased risk At routine exams   HIV All women should be tested at least once for HIV between the ages of 6 and 40 At routine exams. Those with risk factors for HIV should be tested at least annually.   Obesity All women in this age group At routine exams   Syphilis Women at increased risk for infection should talk with their healthcare provider At routine exams   Tuberculosis Women at increased risk for infection should talk with their healthcare provider Ask your healthcare provider   Vision All women in this age group At least 1 complete exam in your 24s, and 2 in your 82s   Vaccine2 Who needs it How often  Chickenpox (varicella) All women in this age group who have no record of this infection or vaccine 2 doses; the second dose should be given 4 to 8 weeks after the first dose   Hepatitis A Women at increased risk for infection should talk with their healthcare provider 2 doses given at least 6 months apart   Hepatitis B Women at increased risk for infection should talk with their healthcare provider 3 doses over 6 months; second dose should be given 1 month after the first dose; the third dose should be given at least 2 months after the second dose and at least 4 months after the first dose   Haemophilus influenzaeType B (HIB) Women at increased risk for infection  should talk with their healthcare provider 1 to 3 doses   Human papillomavirus (HPV) All women in this age group up to age 66 3 doses; the second dose should be given 1 to 2 months after the first dose and the third dose given 6 months after the first dose   Influenza (flu) All women in this age group Once a year   Measles, mumps, rubella (MMR) All women in this age group who have no record of these infections or vaccines 1 or 2 doses   Meningococcal Women at increased risk for infection should talk with their healthcare provider 1 or more doses   Pneumococcal conjugate vaccine (PCV13)and pneumococcal polysaccharidevaccine(PPSV23) Women at increased risk for infection should talk with their healthcare provider PCV13: 1 dose ages 74 to 107 (protects against 13 types of pneumococcal bacteria)  PPSV23: 1 to2 doses through age 24, or 1 dose at 82 or older (protects against 23 types of pneumococcal bacteria)     Tetanus/diphtheria/pertussis (Td/Tdap) booster All women in this age group Td every 10 years, or a one-time dose of Tdap instead of a Td booster after age 26, then Td every 10 years   Counseling Who needs it How often   BRCA gene mutation testing for breast and ovarian cancer susceptibility Women with increased risk for having gene mutation When your risk is known   Breast cancer and chemoprevention Women at high risk for breast cancer When your risk is known   Diet and exercise Women who are overweight or obese When diagnosed, and then at routine exams   Domestic violence Women at the age in which they are able to have children At routine exams   Sexually transmitted infection prevention Women who are sexually active At routine exams   Skin cancer Prevention of skin cancer in fair-skinned adults At routine exams   Use of tobacco and the health effects it can cause All women in this age group Every visit   1 According to the ACS, women ages 8 to 53 years should have a clinical breast exam (CBE) as part of  their routine health exam every 3 years. Breast self-exams are an option for women starting in their 67s.But the USPSTF does not recommend CBE.  Date Last Reviewed: 12/01/2015   2000-2018 [employer]. [address] [place] [address]. All rights reserved. This information is not intended as a substitute for professional medical care. Always follow your healthcare professional's instructions.

## 2017-09-30 ENCOUNTER — Encounter (INDEPENDENT_AMBULATORY_CARE_PROVIDER_SITE_OTHER): Payer: Self-pay | Admitting: Family Medicine

## 2017-09-30 LAB — ANTI THYROID PEROXIDASE: Anti Thyroid Peroxidase: >800 [IU]/mL — ABNORMAL HIGH (ref 0.0–8.9)

## 2017-09-30 NOTE — Telephone Encounter (Signed)
Routing to provider.     Patient reports waist is 29 inches.    Please review and advise. Thank you.

## 2017-09-30 NOTE — Result Encounter Note (Signed)
Results reviewed and released to patient through ecare with a brief explanation.   Advise follow up TSH in 3 months given ab levels

## 2017-09-30 NOTE — Progress Notes (Deleted)
[name]  Department of Otolaryngology- Head & Neck Surgery  [name]       Primary Provider: Elsie Coffey, M.D.    Chief Complaint:    Tonsil stones    History of present illness:     Meagan Coffey is a 27 year old-year-old female, with medical hx of hypothyroidism, ADD, who presents to clinic complaining of frequent tonsil stones.    She reports have tonsilloliths for hte past 3 years. Saw ENT told could have sx.      The patient reports having at least    > 7 episodes in a year OR    >5 episodes in the past 2 years OR    >3 episodes in the past 3 years     With the last episode being  And was treated with     The patient also endorses having  PLUS   At least ONE of the following:    Temperature >100.94 F (38.3 C)    Cervical lymphadenopathy- tender lymph nodes or >2cm    Tonsillar exudate    Positive culture for Group A Betahemolytic Strep (GABHS)   Also consider:    Hx of Peritonsillar abscess    Multiple antibiotic/intolerances    Loss of work    Bleeding tonsils    PFAPA-Periodic fever, aphthous stomatitis, pharyngitis and adenitis (typically seen in children)     These infections have resulted in a loss of work/school?    Worse sore throat for 2 weeks   Risks:   3-4 out of 100 have bleeding. May spit out blood clot.   If bleeding bad, may need to go back to OR to cauterize   Bleeding occurs in first 24 hours or a 5-7 dayslater (like when a scab falls off)   Risk of loose tooth or chip tooth   A retractor is used on the tongue, which can cause it be numb an dcan last for months   Can still get sore throats   No smoking or alcohol for one month   Stay hydrated is important to reduce pain and help healing    Patient denies fever, chills, weight loss, facial weakness, numbness, dysphagia, dysphonia and dyspnea.     Additional factors include:   Inhalant Allergies: {YES ADDL DEFAULT NO:104995::"Yes, ***"}   Migraine Disorder: {YES ADDL DEFAULT NO:104995::"Yes,  ***"}   TMJ Disorder: {YES ADDL DEFAULT NO:104995::"Yes, ***"}   Prior Head or Neck Surgery/Radiation exposure: {YES ADDL DEFAULT NO:104995::"Yes, ***"}   Reactive Airway Disease: {YES ADDL DEFAULT NO:104995::"Yes, ***"}   Current tobacco use: {YES ADDL DEFAULT NO:104995::"Yes, ***"}   Family history of head or neck malignancy: {YES ADDL DEFAULT NO:104995::"Yes, ***"}    Medications:     Please see medication list provided by the patient and confirmed.  Current Outpatient Medications   Medication Sig Dispense Refill    DULoxetine (CYMBALTA) 20 MG Oral CAPSULE ENTERIC COATED PARTICLES Take 20 mg by mouth daily.      Levothyroxine Sodium 50 MCG Oral Tab Take 1 tablet (50 mcg) by mouth daily on an empty stomach. 90 tablet 1    methylphenidate ER (CONCERTA) 36 MG Oral Tab CR Take 36 mg by mouth.       No current facility-administered medications for this visit.        Allergies:     Review of patient's allergies indicates:  Allergies   Allergen Reactions    Penicillins Fever, Rash and Diarrhea/GI  Past Medical and Surgical History:     Please see the Patient Health History - Otolaryngology Form page 1-4 completed today and confirmed with the patient.  Additional significant history includes:  No past medical history on file.  No past surgical history on file.    Family History:     Please see the scanned Patient Health History - Otolaryngology Form page 1-4 completed today  and confirmed with the patient.    Additional significant history includes:  Family History     Problem (# of Occurrences) Relation (Name,Age of Onset)    Cancer (1) Mother    Depression (1) Sister    Lung Cancer (1) Mother ([id number])    Lupus (1) Maternal Aunt          Social History:     Please see the scanned  Patient Health History - Otolaryngology Form page 1-4 completed today and confirmed with the patient.  Additional significant history includes:  Social History     Socioeconomic History    Marital status: Married     Spouse name: Not on  file    Number of children: Not on file    Years of education: Not on file    Highest education level: Not on file   Occupational History    Not on file   Social Needs    Financial resource strain: Not on file    Food insecurity:     Worry: Not on file     Inability: Not on file    Transportation needs:     Medical: Not on file     Non-medical: Not on file   Tobacco Use    Smoking status: Former Smoker     Last attempt to quit: 10/16/2013     Years since quitting: 3.9   Substance and Sexual Activity    Alcohol use: Yes     Frequency: Monthly or less     Comment: occ    Drug use: Not on file    Sexual activity: Not on file   Lifestyle    Physical activity:     Days per week: Not on file     Minutes per session: Not on file    Stress: Not on file   Relationships    Social connections:     Talks on phone: Not on file     Gets together: Not on file     Attends religious service: Not on file     Active member of club or organization: Not on file     Attends meetings of clubs or organizations: Not on file     Relationship status: Not on file    Intimate partner violence:     Fear of current or ex partner: Not on file     Emotionally abused: Not on file     Physically abused: Not on file     Forced sexual activity: Not on file   Other Topics Concern    Not on file   Social History Narrative    Not on file       Review of Systems:  Please see the scanned Patient Health History - Otolaryngology Form page 3 completed today and confirmed with the patient. Pertinent positives noted above in HPI.    Physical Examination:  LMP 09/12/2017   General:  AAOx3, NAD, conversant and cooperative  Head:  Normocephalic, atraumatic  Voice:{sob/doe:103615::"hoarse","breathy","normal","aesthenic","rough","diplophonic","gurgly/wet","strained"}        Eyes:  EOMI, PERRLA, sclera white without  injection or chemosis  Ears: EACs clear, TMs intact without effusion  Nose:  No external deformity, no masses on anterior  rhinoscopy  OC/OP: The lips are without lesions or masses, appropriately colored and moist.  The patient is able to open the mouth widely without trismus.  Hard and soft palate are continuous and smooth with no lesion or masses. The floor of mouth and oral tongue are soft, mucosa pink and no mucosal abnormalities are noted within the oral cavity. The oral tongue is fully mobile and midline on protrusion. Uvula midline, without redness. Uvula rises with phonation and is without lesions or masses. Tonsils are ***+ symmetric and cryptic. Anterior and posterior pillars are pink with no edema, erythema, or exudate.   Opening of the parotids are visualized bilaterally with no edema. Frenulum is intact, blood vessels are visible, and no lesions or abnormalities present. Salivary glands ar not visible, the presence of saliva confirms patency.    Neck:   Trachea is straight, midline, without masses or deviation. Thyroid palpable below the level of the cricoid cartilage. Lobes are non-palpable bilaterally with no masses, thyromegaly or tenderness.   Lymph:   No cervical or submandibular lymphadenopathy or neck masses. No tenderness upon palpation.  Neuro:  Cranial nerves II-XII grossly intact.    Pulm:  No audible stridor or wheezing.  Breathing unlabored.  MSK:  Strength 5/5 throughout, gait ***, no TMJ clicking  Psych:  Appropriate mood and affect  Skin:  No facial rashes or excoriations      Imaging:  The radiographic images and reports available today were reviewed. No images are attached to the encounter.      Laboratory:  The following lab results available today were reviewed.  @LABALLVALUES (TSH:5,T4:5,T3:5)@      Assessment/Plan:  The diagnosis, medical decision making, and treatment options were discussed with the patient in a language and manner that was understood.            We discussed the assessment, diagnosis and treatment of chronic tonsillitis. Upon today's examination, the tonsils were ***. I discussed with  the patient the PARADISE criteria in determining a tonsillectomy. At this time, the patient's history and objective assessment meets/does not meet criteria.  We discussed the treatment options, including no surgical intervention, and expected outcomes of a tonsillectomy. The patient is was made aware of the risks associated with a tonsillectomy, which include severe pain that can lasts for several weeks, oropharyngeal bleeding that may result in an additional surgical treatment, loose or chipped teeth from the retractor, and airway emergency. The patient was informed that even with a tonsillectomy, they can still acquire viral throat infections, such as pharyngitis and laryngitis.    The patient would like to pursue the above surgical intervention and a referral was placed for the patient to be seen by ***.  The patient would like to consider her options and will contact me if she would like a referral placed to see ***.     To help alleviate current discomfort, the patient was encouraged to increase non-caffeinated fluids and do salt-water gargles.     The patient has intermittent tonsilliths that he perceives as an annoyance.  He denies having a history of frequent tonsillitis, odynophagia, peritonsillar abscesses, and positive culture to GABHS. We discussed the assessment, diagnosis and treatment of tonsilliths.  I discussed with the patient that tonsil stones are generally harmelss and tend to not cause any problematic symptoms. We discussed the surgical removal of tonsils and I  explained to the patient the PARADISE criteria in determining if a tonsillectomy is appropriate.  At this time, the patient's history and objective assessment does not meet criteria for a tonsillectomy, and the treatment would not be considered medically necessary. We dicussed how good oral hygiene can help diminish the development of tonsil stones.  The patient was reassured and will continue to focus on good oral hygiene.            Thank you for referring your patient as it was my pleasure and honor to participate in their care. Please feel free to contact me via eCare or page me at [phone] if you have any questions.    Jacklyn Shell, ARNP  Department of Otolaryngology- Head & Neck Surgery  [facility]  [place] [facility]

## 2017-10-07 ENCOUNTER — Encounter (HOSPITAL_COMMUNITY): Payer: Self-pay | Admitting: Radiology

## 2017-10-07 ENCOUNTER — Emergency Department (HOSPITAL_COMMUNITY): Payer: BLUE CROSS/BLUE SHIELD

## 2017-10-07 ENCOUNTER — Emergency Department (HOSPITAL_COMMUNITY)
Admission: EM | Admit: 2017-10-07 | Discharge: 2017-10-07 | Disposition: A | Discharge status: Home / Self Care | Payer: BLUE CROSS/BLUE SHIELD | Attending: Emergency Medicine | Admitting: Emergency Medicine

## 2017-10-07 ENCOUNTER — Other Ambulatory Visit: Payer: Self-pay

## 2017-10-07 ENCOUNTER — Encounter (INDEPENDENT_AMBULATORY_CARE_PROVIDER_SITE_OTHER): Payer: PRIVATE HEALTH INSURANCE | Admitting: Family

## 2017-10-07 ENCOUNTER — Encounter (INDEPENDENT_AMBULATORY_CARE_PROVIDER_SITE_OTHER): Payer: Self-pay | Admitting: Family

## 2017-10-07 DIAGNOSIS — N730 Acute parametritis and pelvic cellulitis: Secondary | ICD-10-CM | POA: Insufficient documentation

## 2017-10-07 DIAGNOSIS — R1031 Right lower quadrant pain: Secondary | ICD-10-CM | POA: Diagnosis present

## 2017-10-07 DIAGNOSIS — A599 Trichomoniasis, unspecified: Secondary | ICD-10-CM | POA: Insufficient documentation

## 2017-10-07 DIAGNOSIS — R102 Pelvic and perineal pain: Secondary | ICD-10-CM | POA: Diagnosis not present

## 2017-10-07 DIAGNOSIS — N83201 Unspecified ovarian cyst, right side: Secondary | ICD-10-CM | POA: Diagnosis not present

## 2017-10-07 LAB — COMPREHENSIVE METABOLIC PANEL
ALBUMIN: 3.9 g/dL (ref 3.5–5.0)
ALK PHOS: 42 U/L (ref 38–126)
ALT: 10 U/L (ref 0–44)
AST: 12 U/L — AB (ref 15–41)
Anion gap: 7 (ref 5–15)
BILIRUBIN TOTAL: 0.7 mg/dL (ref 0.3–1.2)
BUN: 8 mg/dL (ref 6–20)
CALCIUM: 9.1 mg/dL (ref 8.9–10.3)
CO2: 25 mmol/L (ref 22–32)
CREATININE: 0.85 mg/dL (ref 0.44–1.00)
Chloride: 107 mmol/L (ref 98–111)
GFR calc Af Amer: >60 mL/min (ref >60)
GLUCOSE: 93 mg/dL (ref 70–99)
POTASSIUM: 3.9 mmol/L (ref 3.5–5.1)
Sodium: 139 mmol/L (ref 135–145)
TOTAL PROTEIN: 7.7 g/dL (ref 6.5–8.1)

## 2017-10-07 LAB — URINALYSIS, ROUTINE W REFLEX MICROSCOPIC
Bilirubin Urine: NEGATIVE
Glucose, UA: NEGATIVE mg/dL
Hgb urine dipstick: NEGATIVE
KETONES UR: NEGATIVE mg/dL
LEUKOCYTES UA: NEGATIVE
NITRITE: NEGATIVE
PH: 6 (ref 5.0–8.0)
PROTEIN: NEGATIVE mg/dL
Specific Gravity, Urine: 1.014 (ref 1.005–1.030)

## 2017-10-07 LAB — CBC
HCT: 39.8 % (ref 36.0–46.0)
Hemoglobin: 13.6 g/dL (ref 12.0–15.0)
MCH: 28.2 pg (ref 26.0–34.0)
MCHC: 34.2 g/dL (ref 30.0–36.0)
MCV: 82.6 fL (ref 78.0–100.0)
PLATELETS: 230 10*3/uL (ref 150–400)
RBC: 4.82 MIL/uL (ref 3.87–5.11)
RDW: 13.5 % (ref 11.5–15.5)
WBC: 11.1 10*3/uL — AB (ref 4.0–10.5)

## 2017-10-07 LAB — WET PREP, GENITAL
Clue Cells Wet Prep HPF POC: NONE SEEN
Sperm: NONE SEEN
YEAST WET PREP: NONE SEEN

## 2017-10-07 LAB — LIPASE, BLOOD: Lipase: 30 U/L (ref 11–51)

## 2017-10-07 LAB — I-STAT BETA HCG BLOOD, ED (MC, WL, AP ONLY): I-stat hCG, quantitative: <5 m[IU]/mL (ref <5)

## 2017-10-07 MED ORDER — SODIUM CHLORIDE 0.9 % IV BOLUS
1000.0000 mL | Freq: Once | INTRAVENOUS | Status: AC
  Administered 2017-10-07: 1000 mL via INTRAVENOUS

## 2017-10-07 MED ORDER — METRONIDAZOLE 500 MG PO TABS
2000.0000 mg | ORAL_TABLET | Freq: Once | ORAL | Status: AC
  Administered 2017-10-07: 2000 mg via ORAL
  Filled 2017-10-07: qty 4

## 2017-10-07 MED ORDER — CEFTRIAXONE SODIUM 250 MG IJ SOLR
250.0000 mg | Freq: Once | INTRAMUSCULAR | Status: AC
  Administered 2017-10-07: 250 mg via INTRAMUSCULAR
  Filled 2017-10-07: qty 250

## 2017-10-07 MED ORDER — ONDANSETRON HCL 4 MG PO TABS: 4.0000 mg | ORAL_TABLET | Freq: Three times a day (TID) | ORAL | 0 refills | Status: AC | PRN

## 2017-10-07 MED ORDER — MORPHINE SULFATE (PF) 4 MG/ML IV SOLN
4.0000 mg | Freq: Once | INTRAVENOUS | Status: AC
  Administered 2017-10-07: 4 mg via INTRAVENOUS
  Filled 2017-10-07: qty 1

## 2017-10-07 MED ORDER — STERILE WATER FOR INJECTION IJ SOLN
INTRAMUSCULAR | Status: AC
  Administered 2017-10-07: 0.9 mL
  Filled 2017-10-07: qty 10

## 2017-10-07 MED ORDER — METRONIDAZOLE 500 MG PO TABS: 500.0000 mg | ORAL_TABLET | Freq: Two times a day (BID) | ORAL | 0 refills | Status: AC

## 2017-10-07 MED ORDER — IBUPROFEN 600 MG PO TABS: 600.0000 mg | ORAL_TABLET | Freq: Four times a day (QID) | ORAL | 0 refills | Status: AC | PRN

## 2017-10-07 MED ORDER — IOPAMIDOL (ISOVUE-300) INJECTION 61%
100.0000 mL | Freq: Once | INTRAVENOUS | Status: AC | PRN
  Administered 2017-10-07: 100 mL via INTRAVENOUS

## 2017-10-07 MED ORDER — ONDANSETRON HCL 4 MG/2ML IJ SOLN
4.0000 mg | Freq: Once | INTRAMUSCULAR | Status: AC
  Administered 2017-10-07: 4 mg via INTRAVENOUS
  Filled 2017-10-07: qty 2

## 2017-10-07 MED ORDER — AZITHROMYCIN 250 MG PO TABS
1000.0000 mg | ORAL_TABLET | Freq: Once | ORAL | Status: AC
  Administered 2017-10-07: 1000 mg via ORAL
  Filled 2017-10-07: qty 4

## 2017-10-07 MED ORDER — DOXYCYCLINE HYCLATE 100 MG PO CAPS: 100.0000 mg | ORAL_CAPSULE | Freq: Two times a day (BID) | ORAL | 0 refills | Status: AC

## 2017-10-07 NOTE — Discharge Instructions (Addendum)
You were seen here today for abdominal pain.  Your exam was consistent with pelvic inflammatory disease. Pelvic inflammatory disease (PID) refers to an infection in some or all of the female organs. The infection can be in the uterus, ovaries, fallopian tubes, or the surrounding tissues in the pelvis. PID can cause abdominal or pelvic pain that comes on suddenly (acute pelvic pain). PID is a serious infection because it can lead to lasting (chronic) pelvic pain or the inability to have children (infertility).  Youn are going to be treated with antibiotics. Please take all of your antibiotics until finished!   You may develop abdominal discomfort or diarrhea from the antibiotic.  You may help offset this with probiotics which you can buy or get in yogurt. Do not eat or take the probiotics until 2 hours after your antibiotic. Do not take your medicine if develop an itchy rash, swelling in your mouth or lips, or difficulty breathing. Please do not drink alcohol while taking Flagyl as it will make you feel very ill. Please note that doxycycline does cause sensitivity to the sun.  Please avoid prolonged sun exposure or wear sunscreen while taking this medication. You did test positive for trichomonas as we discussed.  This is a STD.  I have attached a handout on this.  You were treated with Flagyl in the department will be discharged home with Flagyl as well.  He will need to inform all sexual partners of this so they can be tested and treated. Your HIV, syphilis, gonorrhea and chlamydia testing are pending. Your pregnancy test was negative.  Please remain sexually abstinent until you complete treatment and all other partners have been treated as well. Your CT scan and ultrasound also showed a right ovarian cyst that ruptured.  This also could be partially protruding to your pain.  I have attached a handout on this. These follow-up with woman's outpatient clinic in 3 days for follow up.  If you develop worsening  or new concerning symptoms you can return to the emergency department for re-evaluation.

## 2017-10-07 NOTE — ED Triage Notes (Signed)
BIB [facility] due to pain in lower rt quad. [facility], primarily nausea and abd pain 6/10, IV established

## 2017-10-07 NOTE — ED Provider Notes (Signed)
[facility]-EMERGENCY DEPT Provider Note   CSN: [id number] Arrival date & time: 10/07/17  1223     History   Chief Complaint Chief Complaint  Patient presents with  . Abdominal Pain    HPI Stacey Buckley is a 27 y.o. female with no significant past medical history presents emergency department today for abdominal pain.  Patient reports that on Monday,/06/2017 she started feeling slow onset of right lower quadrant abdominal pain that she describes as cramping.  She notes that several hours later she had sudden nausea as well as nonbloody, non-melanous diarrhea.  She reports she had episodes of diarrhea every 2 hours from Monday until Wednesday.  She reports her abdominal pain was constant and had increased to an 8/10 by Wednesday.  She was seen at urgent care on Wednesday night where she reports she was given Imodium for her symptoms.  She states her diarrhea has now stopped but she has continued nausea as well as increasing right lower quadrant abdominal pain.  Patient reports she is been taking over-the-counter medication for her symptoms without any relief.  She notes that movement and palpation make her symptoms worse.  She denies any upper abdominal pain.  No prior abdominal surgeries.  She notes she is sexually active with one female partner.  She states her last menstrual cycle was 2 weeks ago and normal.  She denies any pelvic pain or vaginal discharge.  Patient denies any fever, emesis, flank pain, urinary frequency, urinary urgency, dysuria, hematuria, recent antibiotic use, recent travel or sick contacts.  No personal family history of IBD.  She reports she did have a loose stool approximately 2 hours ago but no gross diarrhea.  She is still passing gas.  HPI  No past medical history on file.  There are no active problems to display for this patient.   No pertinent history reported  OB History   None      Home Medications    Prior to Admission medications    Medication Sig Start Date End Date Taking? Authorizing Provider  loperamide (IMODIUM A-D) 2 MG tablet Take 2 mg by mouth 4 (four) times daily as needed for diarrhea or loose stools.   Yes [provider]    Family History No family history on file.  Social History Social History   Tobacco Use  . Smoking status: Not on file  Substance Use Topics  . Alcohol use: Not on file  . Drug use: Not on file     Allergies   Patient has no known allergies.   Review of Systems Review of Systems  All other systems reviewed and are negative.    Physical Exam Updated Vital Signs BP (!) 120/92   Pulse 77   Temp 99 F (37.2 C) (Oral)   Resp 16   Ht 5\' 9"  (1.753 m)   Wt 75.3 kg   SpO2 100%   BMI 24.51 kg/m   Physical Exam  Constitutional: She appears well-developed and well-nourished.  HENT:  Head: Normocephalic and atraumatic.  Right Ear: External ear normal.  Left Ear: External ear normal.  Nose: Nose normal.  Mouth/Throat: Uvula is midline, oropharynx is clear and moist and mucous membranes are normal. No tonsillar exudate.  Mucous membranes are moist.  Eyes: Pupils are equal, round, and reactive to light. Right eye exhibits no discharge. Left eye exhibits no discharge. No scleral icterus.  Neck: Trachea normal. Neck supple. No spinous process tenderness present. No neck rigidity. Normal range of  motion present.  No nuchal rigidity or meningismus  Cardiovascular: Normal rate, regular rhythm and intact distal pulses.  No murmur heard. Pulses:      Radial pulses are 2+ on the right side, and 2+ on the left side.       Dorsalis pedis pulses are 2+ on the right side, and 2+ on the left side.       Posterior tibial pulses are 2+ on the right side, and 2+ on the left side.  No lower extremity swelling or edema. Calves symmetric in size bilaterally.  Pulmonary/Chest: Effort normal and breath sounds normal. She exhibits no tenderness.  Abdominal: Soft. Bowel sounds are  normal. She exhibits no distension. There is tenderness in the right lower quadrant and suprapubic area. There is no rigidity, no rebound, no guarding and no CVA tenderness.  Genitourinary:  Genitourinary Comments: Exam performed by Jacinto Halim, exam chaperoned Pelvic exam: normal external genitalia without evidence of trauma. VULVA: normal appearing vulva with no masses, tenderness or lesion. VAGINA: normal appearing vagina with normal color and discharge, no lesions. CERVIX: normal appearing cervix without lesions, cervical motion tenderness present, cervical os closed with out purulent discharge; vaginal discharge - clear, Wet prep and DNA probe for chlamydia and GC obtained.   ADNEXA: normal adnexa in size and no masses. Right adnexal ttp.  UTERUS: uterus is normal size, shape, consistency and nontender.   Musculoskeletal: She exhibits no edema.  Lymphadenopathy:    She has no cervical adenopathy.  Neurological: She is alert.  Skin: Skin is warm and dry. No rash noted. She is not diaphoretic.  Psychiatric: She has a normal mood and affect.  Nursing note and vitals reviewed.    ED Treatments / Results  Labs (all labs ordered are listed, but only abnormal results are displayed) Labs Reviewed  WET PREP, GENITAL - Abnormal; Notable for the following components:      Result Value   Trich, Wet Prep PRESENT (*)    WBC, Wet Prep HPF POC FEW (*)    All other components within normal limits  COMPREHENSIVE METABOLIC PANEL - Abnormal; Notable for the following components:   AST 12 (*)    All other components within normal limits  CBC - Abnormal; Notable for the following components:   WBC 11.1 (*)    All other components within normal limits  LIPASE, BLOOD  URINALYSIS, ROUTINE W REFLEX MICROSCOPIC  RPR  HIV ANTIBODY (ROUTINE TESTING)  I-STAT BETA HCG BLOOD, ED ([facility], [facility], [facility] ONLY)  GC/CHLAMYDIA PROBE AMP ([place]) NOT AT [facility]    EKG None  Radiology US Transvaginal  Non-ob  Result Date: 10/07/2017 CLINICAL DATA:  Pelvic pain for 3 days. EXAM: TRANSABDOMINAL AND TRANSVAGINAL ULTRASOUND OF PELVIS DOPPLER ULTRASOUND OF OVARIES TECHNIQUE: Both transabdominal and transvaginal ultrasound examinations of the pelvis were performed. Transabdominal technique was performed for global imaging of the pelvis including uterus, ovaries, adnexal regions, and pelvic cul-de-sac. It was necessary to proceed with endovaginal exam following the transabdominal exam to visualize the endometrium and ovaries. Color and duplex Doppler ultrasound was utilized to evaluate blood flow to the ovaries. COMPARISON:  CT of the abdomen and pelvis on 10/07/2017 FINDINGS: Uterus Measurements: 1.6 x 5.1 x 5.9 centimeters. No fibroids or other mass visualized. Endometrium Thickness: 15 millimeters.  No focal abnormality visualized. Right ovary Measurements: 4.5 x 2.4 x 2.1 centimeters. Normal appearance. Small corpus luteum cyst is hypervascular and correlates well with the CT findings. Left ovary Measurements: 2.9 x 1.8  x 1.7 centimeters. Normal appearance/no adnexal mass. Pulsed Doppler evaluation of both ovaries demonstrates normal low-resistance arterial and venous waveforms. Other findings Small amount of free pelvic fluid. IMPRESSION: 1. Normal appearance of the uterus. 2. Small RIGHT corpus luteum cyst correlates well the CT exam. Small amount of free pelvic fluid is present. 3. No suspicious adnexal mass. 4. No evidence for torsion. Electronically Signed   By: Norva Pavlov M.D.   On: 10/07/2017 21:14   US Pelvis Complete  Result Date: 10/07/2017 CLINICAL DATA:  Pelvic pain for 3 days. EXAM: TRANSABDOMINAL AND TRANSVAGINAL ULTRASOUND OF PELVIS DOPPLER ULTRASOUND OF OVARIES TECHNIQUE: Both transabdominal and transvaginal ultrasound examinations of the pelvis were performed. Transabdominal technique was performed for global imaging of the pelvis including uterus, ovaries, adnexal regions, and pelvic  cul-de-sac. It was necessary to proceed with endovaginal exam following the transabdominal exam to visualize the endometrium and ovaries. Color and duplex Doppler ultrasound was utilized to evaluate blood flow to the ovaries. COMPARISON:  CT of the abdomen and pelvis on 10/07/2017 FINDINGS: Uterus Measurements: 1.6 x 5.1 x 5.9 centimeters. No fibroids or other mass visualized. Endometrium Thickness: 15 millimeters.  No focal abnormality visualized. Right ovary Measurements: 4.5 x 2.4 x 2.1 centimeters. Normal appearance. Small corpus luteum cyst is hypervascular and correlates well with the CT findings. Left ovary Measurements: 2.9 x 1.8 x 1.7 centimeters. Normal appearance/no adnexal mass. Pulsed Doppler evaluation of both ovaries demonstrates normal low-resistance arterial and venous waveforms. Other findings Small amount of free pelvic fluid. IMPRESSION: 1. Normal appearance of the uterus. 2. Small RIGHT corpus luteum cyst correlates well the CT exam. Small amount of free pelvic fluid is present. 3. No suspicious adnexal mass. 4. No evidence for torsion. Electronically Signed   By: Norva Pavlov M.D.   On: 10/07/2017 21:14   Ct Abdomen Pelvis W Contrast  Result Date: 10/07/2017 CLINICAL DATA:  Lower quadrant pain and nausea EXAM: CT ABDOMEN AND PELVIS WITH CONTRAST TECHNIQUE: Multidetector CT imaging of the abdomen and pelvis was performed using the standard protocol following bolus administration of intravenous contrast. CONTRAST:  ISOVUE-300 IOPAMIDOL (ISOVUE-300) INJECTION 61% COMPARISON:  None. FINDINGS: Lower chest: Lung bases are clear. Hepatobiliary: There is a degree of hepatic steatosis. No focal liver lesions are evident. Gallbladder wall is not appreciably thickened. There is no biliary duct dilatation. Pancreas: No pancreatic mass or inflammatory focus. Spleen: No splenic lesions are evident. Adrenals/Urinary Tract: Adrenals bilaterally appear normal. Kidneys bilaterally show no evident  mass or hydronephrosis on either side. There is no evident renal or ureteral calculus on either side. Urinary bladder is midline with wall thickness within normal limits. Stomach/Bowel: There is no appreciable bowel wall or mesenteric thickening. No evident bowel obstruction. No free air or portal venous air. Vascular/Lymphatic: There is no abdominal aortic aneurysm. There is no evident vascular calcification. There is no appreciable adenopathy in the abdomen or pelvis. Reproductive: The uterus is anteverted. There is evidence of a ruptured cyst in the left ovary with enhancement of the wall of this ruptured cyst measuring 1.9 x 1.7 cm. Fluid tracks from the right ovary into the cul-de-sac. Slight thickening of the wall of the lower uterine segment may be due to inflammatory change from the adjacent ruptured cysts fluid. No other pelvic lesion is evident. Other: Appendix appears unremarkable. No abscess is evident in the abdomen or pelvis. There is no ascites beyond the relatively slight amount of fluid in the dependent portion of the pelvis. There is a minimal  ventral hernia containing only fat. Musculoskeletal: There are no blastic or lytic bone lesions. No intramuscular lesions are evident. IMPRESSION: 1. Evidence of ovarian cyst rupture on the right with fluid tracking from the right ovary into the cul-de-sac region. Mild thickening of the lower uterine segment may be due to inflammation from adjacent fluid from the ovarian cyst rupture on the right. 2. No bowel obstruction. No abscess in the abdomen pelvis. Appendix appears normal. 3. No evident renal or ureteral calculus. No hydronephrosis. No appreciable urinary bladder wall thickening. 4.  Hepatic steatosis. Electronically Signed   By: Bretta Bang III M.D.   On: 10/07/2017 18:10   [place] Art/ven Flow Abd Pelv Doppler  Result Date: 10/07/2017 CLINICAL DATA:  Pelvic pain for 3 days. EXAM: TRANSABDOMINAL AND TRANSVAGINAL ULTRASOUND OF PELVIS DOPPLER  ULTRASOUND OF OVARIES TECHNIQUE: Both transabdominal and transvaginal ultrasound examinations of the pelvis were performed. Transabdominal technique was performed for global imaging of the pelvis including uterus, ovaries, adnexal regions, and pelvic cul-de-sac. It was necessary to proceed with endovaginal exam following the transabdominal exam to visualize the endometrium and ovaries. Color and duplex Doppler ultrasound was utilized to evaluate blood flow to the ovaries. COMPARISON:  CT of the abdomen and pelvis on 10/07/2017 FINDINGS: Uterus Measurements: 1.6 x 5.1 x 5.9 centimeters. No fibroids or other mass visualized. Endometrium Thickness: 15 millimeters.  No focal abnormality visualized. Right ovary Measurements: 4.5 x 2.4 x 2.1 centimeters. Normal appearance. Small corpus luteum cyst is hypervascular and correlates well with the CT findings. Left ovary Measurements: 2.9 x 1.8 x 1.7 centimeters. Normal appearance/no adnexal mass. Pulsed Doppler evaluation of both ovaries demonstrates normal low-resistance arterial and venous waveforms. Other findings Small amount of free pelvic fluid. IMPRESSION: 1. Normal appearance of the uterus. 2. Small RIGHT corpus luteum cyst correlates well the CT exam. Small amount of free pelvic fluid is present. 3. No suspicious adnexal mass. 4. No evidence for torsion. Electronically Signed   By: Norva Pavlov M.D.   On: 10/07/2017 21:14    Procedures Procedures (including critical care time)  Medications Ordered in ED Medications  sodium chloride 0.9 % bolus 1,000 mL (0 mLs Intravenous Stopped 10/07/17 2210)  ondansetron (ZOFRAN) injection 4 mg (4 mg Intravenous Given 10/07/17 1736)  morphine 4 MG/ML injection 4 mg (4 mg Intravenous Given 10/07/17 1736)  iopamidol (ISOVUE-300) 61 % injection 100 mL (100 mLs Intravenous Contrast Given 10/07/17 1748)  morphine 4 MG/ML injection 4 mg (4 mg Intravenous Given 10/07/17 1957)  cefTRIAXone (ROCEPHIN) injection 250 mg (250 mg  Intramuscular Given 10/07/17 2205)  azithromycin (ZITHROMAX) tablet 1,000 mg (1,000 mg Oral Given 10/07/17 2202)  metroNIDAZOLE (FLAGYL) tablet 2,000 mg (2,000 mg Oral Given 10/07/17 2204)  sterile water (preservative free) injection (0.9 mLs  Given 10/07/17 2215)     Initial Impression / Assessment and Plan / ED Course  I have reviewed the triage vital signs and the nursing notes.  Pertinent labs & imaging results that were available during my care of the patient were reviewed by me and considered in my medical decision making (see chart for details).     26 y.o. female with right lower abdominal pain, nausea and diarrhea since Monday. Diarrhea has resolved after imodium. No reported fever. No recent abx use, travel or sick contacts/persons with similar symptoms. Vital signs are reassuring on presentation.  Exam is with right lower quadrant tenderness palpation.  No peritoneal signs.  Lab work obtained prior to my evaluation shows leukocytosis.  Will obtain  CT scan to rule out intra-abdominal pathology.  Remaining blood work is reassuring.  There is no anemia.  No significant mitral derangements.  No acute kidney injury.  No LFT derangements.  No evidence of DKA.  No evidence of pancreatitis.  No evidence of UTI.  Pregnancy test is negative.  No concern for ectopic.  Pelvic exam performed with noted cervical motion tenderness.  There is also right adnexal tenderness.  There is concern for PID.  Gonorrhea/Chlamydia cultures as well as HIV, RPR were obtained.  Will obtain pelvic ultrasound given patient's right adnexal tenderness.  CT scan without evidence of appendicitis or abscess.  There is no evidence of bowel obstruction.  There is evidence of ovarian cyst rupture on the right.  This is consistent on ovarian ultrasound.  No evidence of ovarian torsion.  No evidence of tubo-ovarian abscess.  I had patient's family and her girlfriend leave the room prior to discussing results with the patient relief  for symptoms related to PID.  Patient's trichomonas test did come back positive.  She was treated in the department with ceftriaxone, azithromycin and Flagyl.  She will be discharged home on doxycycline and Flagyl with follow-up with OB/GYN in the next 72 hours.  Discussed with patient that doxycycline can cause photosensitivity and not to drink alcohol while taking Flagyl.  Repeat abdominal exam without peritoneal signs.  Patient understands she is to inform all sexual partners to be tested.  She is remain sexually abstinent until she completes her treatment and partners have been treated as well.   Specific return precautions discussed. Time was given for all questions to be answered. The patient verbalized understanding and agreement with plan. The patient appears safe for discharge home.  Final Clinical Impressions(s) / ED Diagnoses   Final diagnoses:  PID (acute pelvic inflammatory disease)  Trichimoniasis  Right ovarian cyst    ED Discharge Orders    None       Princella Pellegrini 10/08/17 Lorin Picket    Linwood Dibbles, MD 10/08/17 [phone]

## 2017-10-07 NOTE — ED Notes (Signed)
Patient transported to CT 

## 2017-10-08 LAB — HIV ANTIBODY (ROUTINE TESTING W REFLEX): HIV SCREEN 4TH GENERATION: NONREACTIVE

## 2017-10-08 LAB — GC/CHLAMYDIA PROBE AMP (~~LOC~~) NOT AT ARMC
Chlamydia: NEGATIVE
NEISSERIA GONORRHEA: NEGATIVE

## 2017-10-08 LAB — RPR: RPR: NONREACTIVE

## 2017-10-12 NOTE — Telephone Encounter (Signed)
[facility] Records are being held in the medical records drawer.    Dr. Gerilyn NestleHenkle - Please let PSR know, via addendum to this encounter under the contact log tab, if you would like to review these records.    Patient has an appointment on: N/A    Data entered/Scanned records:  SCANNED: Pap: N/A           Mammo: N/A                      Colonoscopy: N/A           Diabetic Eye Exam: N/A  DATA ENTERED (by lab):            INR: N/A                      LDL: N/A                      A1C: N/A  IMMUNIZATIONS: No Immunizations in records    Records to be shredded: 04/2018

## 2017-10-14 NOTE — Progress Notes (Signed)
[facility]  Department of Otolaryngology- Head & Neck Surgery  [facility] [place]     Primary Provider: Elsie Saas, M.D.    Chief Complaint:    Tonsil stones    History of present illness:     Meagan Coffey is a 27 year old-year-old female, with medical hx of hypothyroidism, ADD, who presents to clinic complaining of frequent tonsil stones, bilaterally, that will result in inflammation, pain, and foul smelling breath. These have been occurring over the past 3 years. She reports seeing an ENT in [place] and was informed should could have tonsillectomy. However, due to financial limitations/insurance, this was not performed at that time.    As noted, she notes her bilateral tonsils will become inflamed and irritated when tonsil stones are present. It will cause a discomfort, especially when swallowing, and cause fouls smelling breath. She currently flushes with water to remove them. She endorses having frequent tonsil infections as a child; postive to strep. No hx of a PTA. She has not had a recent tonsillitis. Patient denies fever, chills, weight loss, facial weakness, numbness, dysphagia, dysphonia and dyspnea.     Additional factors include:   Inhalant Allergies: No   Prior Head or Neck Surgery/Radiation exposure: No   Reactive Airway Disease: No   Current tobacco use: No, former smoker 1/2ppd for 11 years. Quit 4 years ago.   Family history of head or neck malignancy: No    Medications:     Please see medication list provided by the patient and confirmed.  Current Outpatient Medications   Medication Sig Dispense Refill    DULoxetine (CYMBALTA) 20 MG Oral CAPSULE ENTERIC COATED PARTICLES Take 20 mg by mouth daily.      Levothyroxine Sodium 50 MCG Oral Tab Take 1 tablet (50 mcg) by mouth daily on an empty stomach. 90 tablet 1    methylphenidate ER (CONCERTA) 36 MG Oral Tab CR Take 36 mg by mouth.       No current facility-administered medications for this visit.        Allergies:      Review of patient's allergies indicates:  Allergies   Allergen Reactions    Penicillins Fever, Rash and Diarrhea/GI       Past Medical and Surgical History:     Please see the Patient Health History - Otolaryngology Form page 1-4 completed today and confirmed with the patient.  Additional significant history includes:  No past medical history on file.  No past surgical history on file.    Family History:     Please see the scanned Patient Health History - Otolaryngology Form page 1-4 completed today  and confirmed with the patient.    Additional significant history includes:  Family History     Problem (# of Occurrences) Relation (Name,Age of Onset)    Cancer (1) Mother    Depression (1) Sister    Lung Cancer (1) Mother (42)    Lupus (1) Maternal Aunt          Social History:     Please see the scanned  Patient Health History - Otolaryngology Form page 1-4 completed today and confirmed with the patient.  Additional significant history includes:  Social History     Socioeconomic History    Marital status: Married     Spouse name: Not on file    Number of children: Not on file    Years of education: Not on file    Highest education level: Not on  file   Occupational History    Not on file   Social Needs    Financial resource strain: Not on file    Food insecurity:     Worry: Not on file     Inability: Not on file    Transportation needs:     Medical: Not on file     Non-medical: Not on file   Tobacco Use    Smoking status: Former Smoker     Last attempt to quit: 10/16/2013     Years since quitting: 3.9   Substance and Sexual Activity    Alcohol use: Yes     Frequency: Monthly or less     Comment: occ    Drug use: Not on file    Sexual activity: Not on file   Lifestyle    Physical activity:     Days per week: Not on file     Minutes per session: Not on file    Stress: Not on file   Relationships    Social connections:     Talks on phone: Not on file     Gets together: Not on file     Attends religious  service: Not on file     Active member of club or organization: Not on file     Attends meetings of clubs or organizations: Not on file     Relationship status: Not on file    Intimate partner violence:     Fear of current or ex partner: Not on file     Emotionally abused: Not on file     Physically abused: Not on file     Forced sexual activity: Not on file   Other Topics Concern    Not on file   Social History Narrative    Not on file       Review of Systems:  Please see the scanned Patient Health History - Otolaryngology Form page 3 completed today and confirmed with the patient. Pertinent positives noted above in HPI.    Physical Examination:  Pulse 70    Ht 5' 1.2" (1.554 m)    Wt 140 lb (63.5 kg)    LMP 10/13/2017 (LMP Unknown)    SpO2 98%    Breastfeeding? No    BMI 26.28 kg/m   General:  AAOx3, NAD, conversant and cooperative  Head:  Normocephalic, atraumatic  Voice:normal        Eyes:  EOMI, PERRLA, sclera white without injection or chemosis  Ears: EACs clear, TMs intact without effusion  Nose:  No external deformity, no masses on anterior rhinoscopy  OC/OP: The lips are without lesions or masses, appropriately colored and moist.  The patient is able to open the mouth widely without trismus.  Hard and soft palate are continuous and smooth with no lesion or masses. The floor of mouth and oral tongue are soft, mucosa pink and no mucosal abnormalities are noted within the oral cavity. The oral tongue is fully mobile and midline on protrusion. Uvula midline, without redness. Uvula rises with phonation and is without lesions or masses. Tonsils are 1-2+ symmetric and cryptic. No tonsil stones present. Anterior and posterior pillars are pink with no edema, erythema, or exudate.   Neck:   Trachea is straight, midline, without masses or deviation.  Lobes are non-palpable bilaterally with no masses, thyromegaly or tenderness.   Lymph:   No cervical or submandibular lymphadenopathy or neck masses. No tenderness  upon palpation.  Neuro:  Cranial nerves  II-XII grossly intact.    Pulm:  No audible stridor or wheezing.  Breathing unlabored.  Psych:  Appropriate mood and affect  Skin:  No facial rashes or excoriations      Assessment/Plan:  1. Tonsillolith, bilateral    Patient presents with ~3 year hx of bilateral intermittent tonsilloliths that will cause inflammation, discomfort especially when swallowing, and fouling smelling breath. Positive hx of tonsillitis (and strep) as a child. Has not had a recent tonsil infection. We discussed the assessment, diagnosis and treatment of chronic tonsillolith's Upon today's examination, the tonsils were 1-2+, cryptic, with no erythema or tonsil stones.    We discussed the assessment, diagnosis and treatment of tonsilliths.  I discussed with the patient that tonsil stones are generally harmelss and tend to not cause any problematic symptoms. We discussed the surgical removal of tonsils and I explained to the patient the PARADISE criteria in determining if a tonsillectomy is appropriate. She was advised that I am not a surgeon and should be evaluated by an ENT surgeon.  We did discuss expected outcomes of a tonsillectomy. The patient is was made aware of the risks associated with a tonsillectomy, which include severe pain that can lasts for several weeks, oropharyngeal bleeding that may result in an additional surgical treatment, loose or chipped teeth from the retractor, and airway emergency.     The patient would like to meet with a ENT Physician to discuss surgical intervention and a referral was placed for the patient.  We dicussed how good oral hygiene can help diminish the development of tonsil stones.  The patient was reassured and will continue to focus on good oral hygiene.       Thank you for referring your patient as it was my pleasure and honor to participate in their care. Please feel free to contact me via eCare or page me at [phone] if you have any questions.    Jacklyn ShellJamie  Chelcee Korpi, ARNP  Department of Otolaryngology- Head & Neck Surgery  [facility]  [facility]

## 2017-10-15 NOTE — Telephone Encounter (Signed)
Medical record placed in providers basket for review.

## 2017-10-15 NOTE — Telephone Encounter (Signed)
Yes please I would like the records.  thanks

## 2017-10-21 ENCOUNTER — Ambulatory Visit: Payer: PRIVATE HEALTH INSURANCE | Attending: Family Medicine | Admitting: Family

## 2017-10-21 ENCOUNTER — Encounter (INDEPENDENT_AMBULATORY_CARE_PROVIDER_SITE_OTHER): Payer: Self-pay | Admitting: Family

## 2017-10-21 VITALS — HR 70 | Ht 61.2 in | Wt 140.0 lb

## 2017-10-21 DIAGNOSIS — J358 Other chronic diseases of tonsils and adenoids: Secondary | ICD-10-CM

## 2017-10-21 DIAGNOSIS — Z6826 Body mass index (BMI) 26.0-26.9, adult: Secondary | ICD-10-CM

## 2017-10-21 NOTE — Patient Instructions (Addendum)
[facility]  [place]  [phone]   [address]   [place], [place] [id number]     [place]  [phone]   [address], [address]  [place], [place] [address]     [facility]   [phone] [address]  [address]  [place], [place] [id number]  [phone] Phone  [phone] Fax

## 2017-10-22 ENCOUNTER — Other Ambulatory Visit (INDEPENDENT_AMBULATORY_CARE_PROVIDER_SITE_OTHER): Payer: Self-pay | Admitting: Family Medicine

## 2017-10-22 ENCOUNTER — Encounter (INDEPENDENT_AMBULATORY_CARE_PROVIDER_SITE_OTHER): Payer: Self-pay | Admitting: Family Medicine

## 2017-10-22 ENCOUNTER — Encounter (INDEPENDENT_AMBULATORY_CARE_PROVIDER_SITE_OTHER): Payer: Self-pay

## 2017-10-22 DIAGNOSIS — F419 Anxiety disorder, unspecified: Secondary | ICD-10-CM | POA: Insufficient documentation

## 2017-10-22 DIAGNOSIS — Z862 Personal history of diseases of the blood and blood-forming organs and certain disorders involving the immune mechanism: Secondary | ICD-10-CM | POA: Insufficient documentation

## 2017-10-22 DIAGNOSIS — F900 Attention-deficit hyperactivity disorder, predominantly inattentive type: Secondary | ICD-10-CM | POA: Insufficient documentation

## 2017-10-22 DIAGNOSIS — Z124 Encounter for screening for malignant neoplasm of cervix: Secondary | ICD-10-CM | POA: Insufficient documentation

## 2017-10-27 ENCOUNTER — Ambulatory Visit: Payer: BLUE CROSS/BLUE SHIELD | Admitting: Obstetrics and Gynecology

## 2017-10-28 ENCOUNTER — Encounter (INDEPENDENT_AMBULATORY_CARE_PROVIDER_SITE_OTHER): Payer: Self-pay | Admitting: Family Medicine

## 2017-10-28 DIAGNOSIS — F3289 Other specified depressive episodes: Secondary | ICD-10-CM

## 2017-10-28 MED ORDER — DULOXETINE HCL 20 MG OR CPEP
20.0000 mg | DELAYED_RELEASE_CAPSULE | Freq: Every day | ORAL | 1 refills | Status: DC
Start: 2017-10-28 — End: 2018-04-20

## 2017-10-28 NOTE — Telephone Encounter (Signed)
Routing to provider.     [facility] [place].    Did not know amount given or refills. Pended pharmacy.    Please review and advise. Thank you.

## 2017-12-01 ENCOUNTER — Other Ambulatory Visit (INDEPENDENT_AMBULATORY_CARE_PROVIDER_SITE_OTHER): Payer: Self-pay | Admitting: Family Medicine

## 2017-12-07 ENCOUNTER — Encounter (INDEPENDENT_AMBULATORY_CARE_PROVIDER_SITE_OTHER): Payer: Self-pay | Admitting: Family Medicine

## 2017-12-07 ENCOUNTER — Ambulatory Visit (INDEPENDENT_AMBULATORY_CARE_PROVIDER_SITE_OTHER): Payer: PRIVATE HEALTH INSURANCE | Admitting: Family Medicine

## 2017-12-07 VITALS — BP 113/73 | HR 92 | Temp 98.7°F | Ht 61.02 in | Wt 138.0 lb

## 2017-12-07 DIAGNOSIS — L509 Urticaria, unspecified: Secondary | ICD-10-CM

## 2017-12-07 DIAGNOSIS — Z6826 Body mass index (BMI) 26.0-26.9, adult: Secondary | ICD-10-CM

## 2017-12-07 DIAGNOSIS — Z30432 Encounter for removal of intrauterine contraceptive device: Secondary | ICD-10-CM

## 2017-12-07 DIAGNOSIS — R062 Wheezing: Secondary | ICD-10-CM

## 2017-12-07 DIAGNOSIS — B9789 Other viral agents as the cause of diseases classified elsewhere: Secondary | ICD-10-CM

## 2017-12-07 DIAGNOSIS — J22 Unspecified acute lower respiratory infection: Secondary | ICD-10-CM

## 2017-12-07 DIAGNOSIS — Z23 Encounter for immunization: Secondary | ICD-10-CM

## 2017-12-07 MED ORDER — ALBUTEROL SULFATE HFA 108 (90 BASE) MCG/ACT IN AERS
2.0000 | INHALATION_SPRAY | RESPIRATORY_TRACT | 2 refills | Status: DC | PRN
Start: 2017-12-07 — End: 2018-04-20

## 2017-12-07 MED ORDER — ALBUTEROL SULFATE (2.5 MG/3ML) 0.083% IN NEBU
2.5000 mg | INHALATION_SOLUTION | Freq: Once | RESPIRATORY_TRACT | Status: AC
Start: 2017-12-07 — End: 2017-12-07
  Administered 2017-12-07: 2.5 mg via RESPIRATORY_TRACT

## 2017-12-07 MED ORDER — PRENATAL FORMULA 27-1 MG OR TABS
1.0000 | ORAL_TABLET | Freq: Every day | ORAL | 3 refills | Status: AC
Start: 2017-12-07 — End: ?

## 2017-12-07 NOTE — Progress Notes (Signed)
Vaccine Screening Questions    Interpreter: No    1. Are you allergic to Latex? NO    2.  Have you had a serious reaction or an allergic reaction to a vaccine?  NO    3.  Currently have a moderate or severe illness, including fever?  NO    4.  Ever had a seizure or any neurological problem associated with a vaccine? (DTaP/TDaP/DTP pertinent) NO    5.  Is patient receiving any live vaccinations today? (Varicella-Chickenpox, MMR-Measles/Mumps/Rubella, Zoster-Shingles, Flumist, Yellow Fever) NOTE: oral rotavirus is exempt  NO    If YES to any of the questions above - Do NOT give vaccine.  Consult with RN or provider in clinic.  (#5 can be YES if all Live vaccine questions are answered NO)    If NO to all questions above - Patient may receive vaccine.    6.  Do you need to receive the Flu vaccine today? YES - Additional Flu Questions  Flu Vaccine Screening Questions:    Ever had a serious allergic reaction to eggs?  NO    Ever had Guillain-Barre syndrome associated with a vaccine? NO    Less than 6 months old? NO    If YES to any of the Flu questions above - NO Flu Vaccine to be given.  Patient may consult provider as needed.    If NO to all questions above - Patient may receive Flu Shot (IM)    Is the patient requesting Flumist? NO    If between 6 months and 25 years of age, was flu vaccine received last year?  N/A  If NO to above question:   Children who are receiving influenza vaccine for the first time - administer 2 doses of the current influenza vaccine (separated by at least 4 weeks).          All patients are encouraged to wait 15 minutes before leaving after receiving any vaccine.    VIS given 12/07/2017 by Derinda Late, CMA.

## 2017-12-07 NOTE — Progress Notes (Signed)
Chief Complaint:     IUD (Paraguard removal ) and Immunization or Injection (flu, tdap )      HPI:    Ms. Obriant is a 27 year old year old English speaking female who presents today for the following:    -she has decided to remove the ParaGard IUD and start trying for a baby.  She is G1P0 with an ectopic years ago and no complications.    -she has had a fever over th weekend. Came in quickly and started with a bit if a sore throat.  She had had no fever for 2 days but has a persistent cough.  Feels a bit SHORTNESS OF BREATH and has been wheezing.    -hives: patient has this on and off for years. She is not sure if it comes from stress.  Has seen the allergist and was prescribed Singulair.  She is not interested in further evaluation.    Patient Active Problem List   Diagnosis    Hypothyroidism due to Hashimoto's thyroiditis    Hair thinning    Functional diarrhea    Anxiety    Attention deficit hyperactivity disorder (ADHD), predominantly inattentive type    Screening for malignant neoplasm of cervix    H/O iron deficiency anemia       Medications:    Outpatient Medications Prior to Visit   Medication Sig Dispense Refill    DULoxetine (CYMBALTA) 20 MG Oral CAPSULE ENTERIC COATED PARTICLES Take 1 capsule (20 mg) by mouth daily. 90 capsule 1    Levothyroxine Sodium 50 MCG Oral Tab Take 1 tablet (50 mcg) by mouth daily on an empty stomach. 90 tablet 1    methylphenidate ER (CONCERTA) 36 MG Oral Tab CR Take 36 mg by mouth.       No facility-administered medications prior to visit.          ROS:    Constitutional: Denies fatigue or WT loss, no fevers, no chills, no night sweats  Eyes: no blurry vision, no double vision, no eye pain.  ENT: Denies sore throat, ear pain, hearing loss, does have nasal congestion, no hoarse voice. And does have postnasal drip.  CARDIOVASCULAR:Denies chest pain, shortness of breath, no palpitations, no orthopnea, no syncopal episodes, no dizziness or pedal edema, no PND.  RESPIRATORY:   Has some shortness of breath, mild wheezing, no pleuritic and no chest pain    PHYSICAL EXAM:    BP 113/73    Pulse 92    Temp 98.7 F (37.1 C) (Temporal)    Ht 5' 1.02" (1.55 m)    Wt 138 lb 0.1 oz (62.6 kg)    LMP 11/13/2017 (Exact Date)    SpO2 100%    Breastfeeding? No    BMI 26.06 kg/m   General: alert , no acute distress and well-hydrated  Skin: skin color, texture, turgor normal  and no rashes or concerning lesions  Head: normocephalic  Eyes: eyelids normal  Ears: External ears normally formed and normally positioned and TM's clear bilaterally  Nose: nasal mucosa non-edematous, nasal drainage and no sinus tenderness   Oropharynx: oropharynx w/o lesions, erythema, exudate, moist mucosal membranes and no tonsillar enlargement  Neck: supple and no significant lymphadenopathy  Heart: RRR, normal S1, S2 and no murmur  Lungs: moving air well with scattered wheezes and coarse breath sounds. After nebulizer, there ausculation was improved with no wheezing.  GU: normal female genitalia. Cervix visualized with no lesions or abnormal discharge. The IUD strings are visible.  With the aid of ring forceps I applied gentle pressure and the IUD was removed with no complications.  No bleeding.    Assessment and Plan:    1. Encounter for IUD removal  Advised to wait at least 1-2 cycles before trying to conceive.  - REMOVAL INTRAUTERINE DEVICE IUD  Advised to start prenatal vitamins    2. Wheezing    - albuterol 2.5 mg/3 mL (0.083%) Inhalation solution    3. Viral infection of lower respiratory system   advised follow up early next week if not resolved or sooner if worsening    4. Need for Tdap vaccination     - Tdap vacc (adult) 0.5 mL IM - 16109    5. Needs influenza immunisation     - influenza vaccine quadrivalent PF (adult) 0.5 mL IM - 60454    6. Urticaria  Recommended antihistaminics and Singulair if desired. Patient is not interested given the pregnancy risk.  Will follow up PRN    Diagnosis and treatment options have  been explained to the patient including medications side effects.  Questions have been addressed.  Patient verbalized understanding and agrees with the plan outlined today. Patient will return to medical attention if symptoms persist or worsen. Patient advised to read the information that will be provided by his pharmacist and to call if questions.  AVS was reviewed with patient.     Elsie Saas, MD  [facility]  [facility]    Notes on Charting and calculation of UJW:[id number] of coordination of care may appear as preceding or following telephone encounters, or notes in order details sections of orders, or in notes attached to labs ordered incident to this visit.

## 2017-12-22 ENCOUNTER — Encounter (HOSPITAL_BASED_OUTPATIENT_CLINIC_OR_DEPARTMENT_OTHER): Payer: Self-pay | Admitting: Gastroenterology

## 2017-12-22 ENCOUNTER — Ambulatory Visit (HOSPITAL_BASED_OUTPATIENT_CLINIC_OR_DEPARTMENT_OTHER): Payer: PRIVATE HEALTH INSURANCE | Attending: Gastroenterology | Admitting: Gastroenterology

## 2017-12-22 ENCOUNTER — Other Ambulatory Visit (HOSPITAL_BASED_OUTPATIENT_CLINIC_OR_DEPARTMENT_OTHER): Payer: Self-pay | Admitting: Gastroenterology

## 2017-12-22 VITALS — BP 114/50 | HR 69 | Temp 99.3°F | Resp 16 | Ht 60.0 in | Wt 140.0 lb

## 2017-12-22 DIAGNOSIS — Z862 Personal history of diseases of the blood and blood-forming organs and certain disorders involving the immune mechanism: Secondary | ICD-10-CM | POA: Insufficient documentation

## 2017-12-22 DIAGNOSIS — Z6827 Body mass index (BMI) 27.0-27.9, adult: Secondary | ICD-10-CM

## 2017-12-22 DIAGNOSIS — K582 Mixed irritable bowel syndrome: Secondary | ICD-10-CM

## 2017-12-22 DIAGNOSIS — F1011 Alcohol abuse, in remission: Secondary | ICD-10-CM | POA: Insufficient documentation

## 2017-12-22 LAB — SED RATE: Erythrocyte Sedimentation Rate: 8 mm/h (ref 0–20)

## 2017-12-22 LAB — C_REACTIVE PROTEIN: C_Reactive Protein: 1.3 mg/L (ref 0.0–10.0)

## 2017-12-22 NOTE — Patient Instructions (Signed)
It was a pleasure seeing you in clinic today. Please let us know if you have any questions for [place] and we look forward to seeing at your next appointment.     To schedule an appointment or if you have questions, please call our patient care coordinator at [phone]    After today's visit, the following are recommended for your treatment plan:      1. Blood test    2. Psyllium two times day    3. Low FODMAP diet                                                                                          FODMAP DIET    Some sugars are difficult to digest. These sugars, called fermentable Obligo-, Di, Mono-sacharides And Polyols (FODMAPs) may trigger abdominal pain, cramping, bloating, loose stools, and/or gaseous distension. A low FODMAP diet may help reduce these symptoms.    The FODMAPs in the diet are:   Fructose (fruits, honey, high fructose corn syrup (HFC)   Lactose (dairy)   Fructans (Wheat, garlic, onions), fructans are also known as insulin if you read a label.   Galactans (lentils, beans, legumes such as soy)   Polyols (stone fruits such as apricots, peaches, plums, cherries, avocado and artificial sweeteners ( such as sorbitol, mannitol, xylitol, maltitol)    A low FODMAP diet can improve symptoms in people with irritable bowel syndrome (IBS).    Follow the diet for 6 weeks to determine wheter you may benefit from the changes.   Read food labels: Avoid foods that have high FODMAP contents such as wheat, soy, honey, high fructose corn syrup (HFCS), insulin   Use gluten-free bread products. Alternative foods for wheat include potato, rice, corn, quinoa.   Limit foods with high fiber; Foods that are high in fiber may also have high amounts of FODMAPs, but even those with low FODMAPs can sometimes cause problems.    Phone apps are available for the FODMAP diet and can be downloaded from the Internet.   For a smart phone  https://play.google.com/store/apps/details?=com.seancolombo.fodmap&hl=en  For an  iphone  https://itunes.apple.com/us/app/low-fodmap-ibs-diet/id569607899?mt=8    Below is a short table of foods to avoid and foods to eat.          Foods suitable on a low-fodmap diet      Fruit Vegetable Grain Foods Milk Products Other       Banana, blueberry,boysenberry,canteloupe,cranberry, durian, grape, grapefruit, honeydew, melon, kiwifruit, lemon, lime, mandarin, orange, passion fruit, pawpaw, raspberry, rhubarb, rock melon, star anise, strawberry, tangelo         Alfalfa, artichoke, bamboo, shoots, bean, shoots, bean shoots, bok choy, carrot, celery, choko, choy sum, endive, ginger, green beans, lettuce, olives, parsnip, potato, pumpkin, red capsicum (bell pepper), silver beet, spinach, summer squash (yellow), Swede, sweet potato, taro, tomato, turnip, yam, zucchini       Cereals  gluten-free bread or cereal products    Bread  100% spelt bread    Rice    Oats    Polenta    Other  Arrowroot, millet, psyllium, quinoa, sorgum, tapioca     Milk  Lactose-free milk, oat  milk*, rice milk, soy milk*    *Check for additives    Cheeses  Hard cheeses, and brie and camembert    Yoghurt  Lactose-free varieties    Ice cream substitutes  Gelati, sorbet    Butter substitutes   Olive oil       Sweeteners  Sugar* (sucrose), glucose, artificial sweeteers not ending in "-ol"    Honey substitutes  Golden syrup*  Maple syrup*, molasses, treacle    *small quantities           Eliminate foods containing fodmaps    Excess Fructose Lactose Fructans Glactans Polyols     Fruit  Apple, mango, nashi, pear, tinned, fruit in natural juice, watermelon    Sweeteners  Fructose, high fructose corn syrup    Large total fructose dose  Concentrated fruit, sources, large serves of fruit, dried fruit, fruit juice    Honey  Corn syrup, fruisana                 Milk  Milk from cows, goats or sheep, custard, ice cream, yoghurt    Cheeses  Soft uripened, cheeses, cottage, cream, mascarpone, ricotta    Vegetables  Asparagus, beetroot, broccoli,  brussels, sprouts, cabbage, eggplant, fennel, garlic, leek, okra, onion (all), shallots, spring onion    Cereals  Wheat and rye, in large amounts eg. Bread, crackrs, cookies, couscous, pasta    Fruit  Custard apple, persimmon, watermelon    Miscellaneous  Chicory, dandelion, inulin   Legumes  Baked beans, chickpeas, kidney beans, lentils   Fruit  Apple, apricot, avocado, blackberry, cherry, lychee, nashi, nectarin, peach, pear, plum, prune, watermelon    Vegetables  Cauliflower, green capsicum (bell pepper), mushroom, sweet corn    Sweeteners  Sorbitol(420), mannitol(421), isomalt(953), maltitol(965),  MVHQION(629)

## 2017-12-22 NOTE — Progress Notes (Signed)
CC/ID: Patient seen at the request of Dr.Esther Henkle for evaluation and management of altered bowel habit     HISTORY OF PRESENT ILLNESS:  This is a 27 year old female with history of ADD and remote history of binge alcohol drinking    In 08/2015 she started taking medication for ADD. Since then she started experiencing frequent loose bowel movements. She stopped the medication in 11/2016. Even after she stopped medications, she still has  experienced altered bowel habit characterized with alternating constipation and diarrhea. She has tried gluten avoidance from which she feels moderate improvement but her stool consistency is not completely normalized. The frequency of her stool depends day by day up to 3-4 times a day. She also feels abdominal bloating and occasionally sees visible abdominal distention.  She feels discomfort as vertical band-like fashion below the umbilicus. The range of her weight change is about 5Ib since 11/2016.     She denies tarry stools, bright red blood per rectum or tenesmus. She sometimes feels nausea and vomits on very rare occasions. She denies postprandial epigastric pain.    She has a history of alcohol drinking for 7 years (age 1 to 1) at that time she drank alcohol almost every day. She used marijuana and recently switched to cannabis tincture.    She has no family history of celiac disease, inflammatory bowel disease or gastrointestinal cancers.    Labs  09/20/2017 TTG-IgA <1  09/29/2017 Hb 13.8 Hct 43 MCV 89       Endoscopies:  None    Body Imaging:   None      PAST MEDICAL HISTORY:  Past Medical History:   Diagnosis Date    Attention deficit disorder (ADD)     Substance abuse ([facility])     Thyroid disease        PAST SURGICAL HISTORY:  Past Surgical History:   Procedure Laterality Date    TX ECTOPIC PREGNANCY W/O SALPING&/OOPHORECTOMY         ALLERGIES:  Review of patient's allergies indicates:  Allergies   Allergen Reactions    Penicillins Fever, Rash and Diarrhea/GI        MEDICATION:   Current Outpatient Medications:     albuterol 108 (90 Base) MCG/ACT Inhalation Aero Soln, Inhale 2 puffs by mouth every 4 hours as needed   Levothyroxine Sodium 50 MCG Oral Tab, Take 1 tablet (50 mcg) by mouth daily on an empty stomach., Disp: 90 tablet, Rfl: 1    Prenatal Vit-Fe Fumarate-FA (PRENATAL FORMULA) 27-1 MG Oral Tablet, Take 1 tablet by mouth daily., Disp: 100 tablet, Rfl: 3      FAMILY HISTORY:  Colorectal Cancer No, Other GI Cancer No, Inflammatory Bowel Disease/Celiac disease      SOCIAL HISTORY:  Social History     Socioeconomic History    Marital status: Married   Tobacco Use    Smoking status: Former Smoker     Packs/day: 0.00     Last attempt to quit: 10/16/2013     Years since quitting: 4.1    Smokeless tobacco: Never Used   Substance and Sexual Activity    Alcohol use: Yes     Frequency: Monthly or less     Drinks per session: 1 or 2     Binge frequency: Never     Comment: occ    Drug use: Yes     Types: Marijuana    Sexual activity: Yes     Partners: Male     Birth  control/protection: I.U.D.       REVIEW OF SYSTEMS:  A complete review of systems was performed and pertinent positives/negatives are described in the HPI.   No fever, no chills, no headache, no confusion, no dizziness, no cough, no sputum, no dyspnea, no chest pain, no palpitation, + some joint pain, no muscle pain, no rash, no dysuria, no hematuria, no leg/foot pain      PHYSICAL EXAM:  Vital signs reviewed.  GEN: Well appearing, no acute distress.  Head: NCAT  Eyes: sclera anicteric, no conjunctival pallor  ENT: mouth/oropharynx clear  Neck: supple  Lymph: no cervical or supraclavicular lymphadenopathy   Lungs: CTA bilaterally, no respiratory distress.  ABD: not distended, normal active bowel sounds, non-tender, non-tympanic, no palpable masses.  Skin: No jaundice, or spider angiomata  Neuro: Alert and oriented, grossly non-focal.  Psych: Appropriate affect      LABS:  Results for orders placed or  performed in visit on 11/91/47   Basic Metabolic Panel   Result Value Ref Range    Sodium 140 135 - 145 meq/L    Potassium 4.4 3.6 - 5.2 meq/L    Chloride 103 98 - 108 meq/L    Carbon Dioxide, Total 28 22 - 32 meq/L    Anion Gap 9 4 - 12    Glucose 78 62 - 125 mg/dL    Urea Nitrogen 6 (L) 8 - 21 mg/dL    Creatinine 0.52 0.38 - 1.02 mg/dL    Calcium 9.8 8.9 - 10.2 mg/dL    GFR, Calc, European American >60 >59 mL/min/[1.73_m2]    GFR, Calc, African American >60 >59 mL/min/[1.73_m2]    GFR, Information       Calculated GFR in mL/min/1.73 m2 by MDRD equation.  Inaccurate with changing renal function.  See [email or web address]://depts.[email or web address]     No results found for this or any previous visit.  Results for orders placed or performed in visit on 09/15/17   Thyroid Stimulating Hormone   Result Value Ref Range    Thyroid Stimulating Hormone 2.141 0.400 - 5.000 u[IU]/mL       Assessment/Plan:  Thus far she lacks red-flag alarming signs/symptoms (no projectile vomiting, no anemia, no bloody stools, or no progressive weight loss). Given her history, I suspect likelihood of GI dysmotility/IBS is high.  I have discussed with the patient about the diagnostic rationale, natural history, treatment options and expected goals of IBS. I will check total IgA to make sure it is normal and CRP/ESR today to screen any enteric inflammations. I raised a possibility of chronic pancreatitis but I think it is less likely. I also discussed about maldigestion and multiple absorption.     At this point, I have advised to start low FODMAP diet, continue consistently and taking psyllium BID. Over the next 4 weeks if she fails to improve, then I will check LFT, EGD (gastric and duodenal biopsy) and colonoscopy (TI intubation and random colon biopsy).     RTC in 3 months. Will schedule EGD/Colonoscopy before the next clinic appointment depending on her additional lab result today and response to diet modification.      This is a  complex referral case and I have reviewed medical records including office visit notes, laboratory results. It required 5 minutes.     I spent a total time of 45 minutes face-to-face with the patient, of which more than 50% was spent counseling and coordinating care as outlined in this note and in the AVR.    Nena Jordan  Lillia Dallas, MD  Attending Physician, Division of Gastroenterology  [facility]   [phone] option 2  GI Office  [phone] / [phone]

## 2017-12-23 ENCOUNTER — Encounter (HOSPITAL_BASED_OUTPATIENT_CLINIC_OR_DEPARTMENT_OTHER): Payer: Self-pay | Admitting: Gastroenterology

## 2017-12-23 LAB — IMMUNOGLOBULIN A: Immunoglobulin A: 166 mg/dL (ref 84–499)

## 2017-12-31 ENCOUNTER — Other Ambulatory Visit (INDEPENDENT_AMBULATORY_CARE_PROVIDER_SITE_OTHER): Payer: Self-pay | Admitting: Family Medicine

## 2017-12-31 DIAGNOSIS — E038 Other specified hypothyroidism: Secondary | ICD-10-CM

## 2018-01-01 MED ORDER — LEVOTHYROXINE SODIUM 50 MCG OR TABS
50.0000 ug | ORAL_TABLET | Freq: Every day | ORAL | 0 refills | Status: DC
Start: 2018-01-01 — End: 2018-03-04

## 2018-01-01 NOTE — Telephone Encounter (Signed)
Should have had one refill on her original rx.   Will send another 90 days

## 2018-03-01 NOTE — Telephone Encounter (Signed)
Final Review Reminder:    [facility] will be shredded the end of the month, 12/31.

## 2018-03-04 ENCOUNTER — Other Ambulatory Visit (INDEPENDENT_AMBULATORY_CARE_PROVIDER_SITE_OTHER): Payer: Self-pay | Admitting: Physician Assistant

## 2018-03-04 DIAGNOSIS — E038 Other specified hypothyroidism: Secondary | ICD-10-CM

## 2018-03-07 MED ORDER — LEVOTHYROXINE SODIUM 50 MCG OR TABS
50.0000 ug | ORAL_TABLET | Freq: Every day | ORAL | 1 refills | Status: DC
Start: 2018-03-07 — End: 2018-09-06

## 2018-03-17 ENCOUNTER — Ambulatory Visit (HOSPITAL_BASED_OUTPATIENT_CLINIC_OR_DEPARTMENT_OTHER): Payer: PRIVATE HEALTH INSURANCE | Admitting: Gastroenterology

## 2018-03-28 ENCOUNTER — Encounter (INDEPENDENT_AMBULATORY_CARE_PROVIDER_SITE_OTHER): Payer: Self-pay

## 2018-03-31 NOTE — Telephone Encounter (Signed)
Records shredded 03/31/2018.

## 2018-04-20 ENCOUNTER — Ambulatory Visit (INDEPENDENT_AMBULATORY_CARE_PROVIDER_SITE_OTHER): Payer: PRIVATE HEALTH INSURANCE | Admitting: Family Medicine

## 2018-04-20 ENCOUNTER — Encounter (INDEPENDENT_AMBULATORY_CARE_PROVIDER_SITE_OTHER): Payer: Self-pay | Admitting: Family Medicine

## 2018-04-20 VITALS — BP 109/74 | HR 93 | Temp 98.6°F | Resp 12 | Ht 60.0 in | Wt 135.6 lb

## 2018-04-20 DIAGNOSIS — E038 Other specified hypothyroidism: Secondary | ICD-10-CM

## 2018-04-20 DIAGNOSIS — Z6826 Body mass index (BMI) 26.0-26.9, adult: Secondary | ICD-10-CM

## 2018-04-20 DIAGNOSIS — E063 Autoimmune thyroiditis: Secondary | ICD-10-CM

## 2018-04-20 DIAGNOSIS — L509 Urticaria, unspecified: Secondary | ICD-10-CM

## 2018-04-20 DIAGNOSIS — M25562 Pain in left knee: Secondary | ICD-10-CM

## 2018-04-20 DIAGNOSIS — Z9089 Acquired absence of other organs: Secondary | ICD-10-CM

## 2018-04-20 DIAGNOSIS — K648 Other hemorrhoids: Secondary | ICD-10-CM

## 2018-04-20 DIAGNOSIS — G8929 Other chronic pain: Secondary | ICD-10-CM

## 2018-04-20 DIAGNOSIS — M25561 Pain in right knee: Secondary | ICD-10-CM

## 2018-04-20 LAB — THYROID STIMULATING HORMONE: Thyroid Stimulating Hormone: 1.852 u[IU]/mL (ref 0.400–5.000)

## 2018-04-20 LAB — T3: Triiodothyronine (T3): 112 ng/dL (ref 73–178)

## 2018-04-20 LAB — RHEUMATOID FACTOR: Rheumatoid Factor: <10 [IU]/mL (ref <15)

## 2018-04-20 LAB — T4, FREE: Thyroxine (Free): 1 ng/dL (ref 0.6–1.2)

## 2018-04-20 MED ORDER — HYDROCORTISONE 2.5 % RE CREA
TOPICAL_CREAM | RECTAL | 1 refills | Status: DC
Start: 2018-04-20 — End: 2018-08-15

## 2018-04-20 NOTE — Progress Notes (Signed)
CC:    Chief Complaint   Patient presents with   . Surgery     f/u tonsillectomy, wanted to discuss hemorroid   . Follow up Lab/Xray Studies     would like labs done        HPI  Meagan Coffey is a 28 year old female who presents to the office today for follow up:    -follow up on hypothyroidism: she has been stable form the symptoms stand point although continues to have fatigue.    -S/P tonsillectomy: fullness and occasionally burning on the tonsillar area x 2/week.  Surgery by Dr Nedra Hai on12/6/20.  No fevers and no systemic symptoms. Throat seem dry.    -growth on her anal area maybe since sep 2018. Started with Ritalin which cause diarrhea. Perineal area. About 1 inch and started back when taking ritalin and having diarrhea.  Generally uncomnfortable    -anxiety: stopped Cymbalta. It did not seem to help and also felt it was not safe for pregnancy.  Has established care with a psychiatrist and she was diagnosed again as having ADD.  She is taking Adderall now and it really helps her with there anxiety    -continues to have episodes of about 12 hours of pain and swelling on the knees (bilateral). Not related to motion.  No fever and no clear morning stiffness.  Sometimes along with episodes of urticaria.    The following portions of the patient's history were reviewed with the patient and updated as appropriate: problem list, current medications, allergies, past medical history, past surgical history, past social history and past family history    Past Medical History  Past Medical History:   Diagnosis Date   . Attention deficit disorder (ADD)    . Substance abuse ([facility])    . Thyroid disease        Past Surgical History  Past Surgical History:   Procedure Laterality Date   . TX ECTOPIC PREGNANCY W/O SALPING&/OOPHORECTOMY         Allergies  Review of patient's allergies indicates:  Allergies   Allergen Reactions   . Penicillins Fever, Rash and Diarrhea/GI       Medications  Current Outpatient Medications      Medication Sig Dispense Refill   . albuterol 108 (90 Base) MCG/ACT Inhalation Aero Soln Inhale 2 puffs by mouth every 4 hours as needed for shortness of breath/wheezing. (Patient not taking: Reported on 04/20/2018) 1 Inhaler 2   . amphetamine-dextroAMPHetamine (Adderall) 15 MG tablet Take 15 mg by mouth daily.     . DULoxetine (CYMBALTA) 20 MG Oral CAPSULE ENTERIC COATED PARTICLES Take 1 capsule (20 mg) by mouth daily. (Patient not taking: Reported on 12/22/2017) 90 capsule 1   . levothyroxine 50 MCG tablet Take 1 tablet (50 mcg) by mouth daily on an empty stomach. 90 tablet 1   . methylphenidate ER (CONCERTA) 36 MG Oral Tab CR Take 36 mg by mouth.     . Prenatal Vit-Fe Fumarate-FA (PRENATAL FORMULA) 27-1 MG Oral Tablet Take 1 tablet by mouth daily. 100 tablet 3     No current facility-administered medications for this visit.        Family History  Family History     Problem (# of Occurrences) Relation (Name,Age of Onset)    Cancer (1) Mother    Depression (1) Sister    Lung Cancer (1) Mother (83)    Lupus (1) Maternal Aunt       Negative family history  of: Colorectal Cancer             Social History     Tobacco Use   . Smoking status: Former Smoker     Packs/day: 0.00     Last attempt to quit: 10/16/2013     Years since quitting: 4.5   . Smokeless tobacco: Never Used   Substance Use Topics   . Alcohol use: Yes     Frequency: Monthly or less     Drinks per session: 1 or 2     Binge frequency: Never     Comment: occ   . Drug use: Yes     Types: Marijuana       ROS: per HPI    Physical Exam  BP 109/74   Pulse 93   Temp 98.6 F (37 C) (Temporal)   Resp 12   Ht 5' (1.524 m)   Wt 135 lb 9.3 oz (61.5 kg)   LMP 04/06/2018 (Approximate)   SpO2 100%   Breastfeeding No   BMI 26.48 kg/m  Body mass index is 26.48 kg/m.  General appearance: healthy, alert, no distress, relaxed, cooperative, smiling, oriented x 3  Head:normocephalic and atraumatic  Eyes: PERRLA, EOMI  Ears: normal ears on inspection with patent EEC  and intact TM, no erythema and no retraction.  Nose: normal nasal mucosa, nares are patent  Mouth: MMM, good dentition, no erythema and no lesions on the post pharyngeal mucosa with slight swelling on the E PPM and less so on the L side. No exudates.   Normal dentition.  Neck: supple, no cervical lymphadenopathies are noticed.   Skin: Skin color, texture, turgor normal. No rashes or concerning lesions  EXT:no clubbing, cyanosis, or edema  MSK: there is no effusion or knee pain at the moment.  GU: normal anal sphincter, normal tone. external hemorrhoid, not thrombosed a 6 o'clock  Neuro: grossly normal on inspection including gait and coordination.  Psych: normal speech, affect and behavior.      Assessment/Plan    1. Hypothyroidism due to Hashimoto's thyroiditis  Stable. Will check levels and adjust medication as needed  - Thyroid Stimulating Hormone  - Free T4  - T3    2. S/P tonsillectomy  Advised to follow up with Dr Nedra Hai form ENT about this.    3. Other hemorrhoids  Avoid prolonged sitting, no staining   - hydrocortisone (Proctosol HC) 2.5 % rectal cream; Apply on affected area BID until resolved  Dispense: 1 Tube; Refill: 1    4. Chronic pain of both knees  Will do some more antibodies and consider ecare consult.  - Limited Food Allergy Panel  - ANA Reflex Comprehensive Panel  - Rheumatoid Factor    5. Hives  Consider allergy referral.  - Limited Food Allergy Panel    Advised follow up with results and in 3-6 months    Diagnosis and treatment options have been explained to the patient including medications side effects.  Questions have been addressed.  Patient verbalized understanding and agrees with the plan outlined today. Patient will return to medical attention if symptoms persist or worsen. Patient advised to read the information that will be provided by his pharmacist and to call if questions.  AVS was reviewed with patient.     Elsie Saas, MD  [facility]  [facility]    Notes on Charting  and calculation of BPJ:PETKKOE of coordination of care may appear as preceding or following telephone encounters, or notes in  order details sections of orders, or in notes attached to labs ordered incident to this visit.

## 2018-04-21 LAB — LIMITED FOOD ALLERGY PANEL
Egg White, IgE Result: <0.1 k[IU]/L (ref 0.00–0.34)
Milk, IgE Result: <0.1 k[IU]/L (ref 0.00–0.34)
Peanut, IgE Result: <0.1 k[IU]/L (ref 0.00–0.34)
Soy, IgE Result: <0.1 k[IU]/L (ref 0.00–0.34)
Wheat, IgE Result: <0.1 k[IU]/L (ref 0.00–0.34)

## 2018-04-21 LAB — ANA REFLEX COMPREHENSIVE PANEL
Ana Interpretation Comment 1: NEGATIVE
Ana Screen By Multiplex: NEGATIVE
Antibodies to Nuclear Ags by IF (ANA): NEGATIVE

## 2018-06-16 ENCOUNTER — Other Ambulatory Visit (INDEPENDENT_AMBULATORY_CARE_PROVIDER_SITE_OTHER): Payer: Self-pay | Admitting: Family Medicine

## 2018-06-17 ENCOUNTER — Encounter (INDEPENDENT_AMBULATORY_CARE_PROVIDER_SITE_OTHER): Payer: Self-pay

## 2018-06-22 ENCOUNTER — Telehealth (INDEPENDENT_AMBULATORY_CARE_PROVIDER_SITE_OTHER): Payer: PRIVATE HEALTH INSURANCE | Admitting: Family Medicine

## 2018-08-08 ENCOUNTER — Other Ambulatory Visit (INDEPENDENT_AMBULATORY_CARE_PROVIDER_SITE_OTHER): Payer: Self-pay | Admitting: Family Medicine

## 2018-08-13 ENCOUNTER — Ambulatory Visit: Payer: Self-pay

## 2018-08-13 ENCOUNTER — Telehealth (INDEPENDENT_AMBULATORY_CARE_PROVIDER_SITE_OTHER): Payer: Self-pay | Admitting: Family Medicine

## 2018-08-13 NOTE — Telephone Encounter (Signed)
Reason for Disposition  . MILD vaginal bleeding (i.e., clots or similar to menstrual period; not just spotting)    Additional Information  . Negative: Shock suspected (e.g., cold/pale/clammy skin, too weak to stand, low BP, rapid pulse)  . Negative: Difficult to awaken or acting confused  (e.g., disoriented, slurred speech)  . Negative: Passed out (i.e., lost consciousness, collapsed and was not responding)  . Negative: Sounds like a life-threatening emergency to the triager  . Negative: SEVERE abdominal pain  . Negative: [1] SEVERE vaginal bleeding (i.e., soaking 2 pads / hour, large blood clots) AND [2] present 2 or more hours  . Negative: [1] MODERATE vaginal bleeding (i.e., soaking 1 pad / hour; clots) AND [2] present > 6 hours  . Negative: [1] MODERATE vaginal bleeding (i.e., soaking 1 pad / hour; clots) AND [2] pregnant > 12 weeks  . Negative: Passed tissue (e.g., gray-white)  . Negative: Shoulder pain  . Negative: Pale skin (pallor) of new onset or worsening  . Negative: Patient sounds very sick or weak to the triager  . Negative: [1] Constant abdominal pain AND [2] present > 2 hours  . Negative: Fever > 100.4 F (38.0 C)    Protocols used: PREGNANCY - VAGINAL BLEEDING LESS THAN [redacted] WEEKS EGA-ADULT-AH

## 2018-08-13 NOTE — Telephone Encounter (Signed)
Pt returned clinic's call - scheduled pt for opening on Monday, although appt is only 20 minutes long.     Routing FYI to provider and OB nurse.

## 2018-08-13 NOTE — Telephone Encounter (Addendum)
Regarding: [place] PT: [redacted] WEEKS PREGNANT- spotting for couple days, cramping and bleeding today is thicker and darker  ----- Message from [facility] sent at 08/13/2018  9:52 AM PDT -----  [place] PT: [redacted] WEEKS PREGNANT- spotting for couple days, cramping and bleeding today is thicker and darker.     CCL [place]: Patient reports positive home pregnancy test ~ 07/29/2018.  LMP 07/28/2018. Has not established prenatal care.  Reports spotting intermittent since 08/08/2018, mid lower abdominal cramping today, bright red bleeding/changing panty line every 30 mins.reporting it is not saturated when changing.  CCL called [facility] backline-no OB providers in today. Advised pt to call CCL back prn.  Advised ED if passes tissue or saturating sanitary pad every 2 hours or less. Advised to save specimen should tissue be passed.  High priority message routed to pcp/[facility].

## 2018-08-13 NOTE — Telephone Encounter (Signed)
LVMTCB to schedule an appt with an OBGYN provider.     CCR: If pt calls back, please warm transfer to [phone]. Thank you!    Postponing TE.    Routing to [facility] providers and nurse triage to review and advise.     ------Message from Nurse Triage 08/13/2018------------------------    Gerald Dexter, RN  [facility] Clinical Support Pool 2 hours ago (10:34 AM)     Please contact patient 08/15/2018.    Routing comment       Gerald Dexter, RN 2 hours ago (10:34 AM)         Reason for Disposition   MILD vaginal bleeding (i.e., clots or similar to menstrual period; not just spotting)    Additional Information   Negative: Shock suspected (e.g., cold/pale/clammy skin, too weak to stand, low BP, rapid pulse)   Negative: Difficult to awaken or acting confused  (e.g., disoriented, slurred speech)   Negative: Passed out (i.e., lost consciousness, collapsed and was not responding)   Negative: Sounds like a life-threatening emergency to the triager   Negative: SEVERE abdominal pain   Negative: [1] SEVERE vaginal bleeding (i.e., soaking 2 pads / hour, large blood clots) AND [2] present 2 or more hours   Negative: [1] MODERATE vaginal bleeding (i.e., soaking 1 pad / hour; clots) AND [2] present > 6 hours   Negative: [1] MODERATE vaginal bleeding (i.e., soaking 1 pad / hour; clots) AND [2] pregnant > 12 weeks   Negative: Passed tissue (e.g., gray-white)   Negative: Shoulder pain   Negative: Pale skin (pallor) of new onset or worsening   Negative: Patient sounds very sick or weak to the triager   Negative: [1] Constant abdominal pain AND [2] present > 2 hours   Negative: Fever > 100.4 F (38.0 C)    Protocols used: PREGNANCY - VAGINAL BLEEDING LESS THAN [redacted] WEEKS EGA-ADULT-AH           Documentation       Gerald Dexter, RN 2 hours ago (10:34 AM)        Regarding: [place] PT: [redacted] WEEKS PREGNANT- spotting for couple days, cramping and bleeding today is thicker and darker  ----- Message from [facility] sent at 08/13/2018  9:52 AM  PDT -----  [place] PT: [redacted] WEEKS PREGNANT- spotting for couple days, cramping and bleeding today is thicker and darker.     CCL [place]: Patient reports positive home pregnancy test ~ 07/29/2018.  LMP 07/28/2018. Has not established prenatal care.  Reports spotting intermittent since 08/08/2018, mid lower abdominal cramping today, bright red bleeding/changing panty line every 30 mins.reporting it is not saturated when changing.  CCL called [facility] backline-no OB providers in today. Advised pt to call CCL back prn.  Advised ED if passes tissue or saturating sanitary pad every 2 hours or less. Advised to save specimen should tissue be passed.  High priority message routed to pcp/[facility].

## 2018-08-15 ENCOUNTER — Ambulatory Visit (INDEPENDENT_AMBULATORY_CARE_PROVIDER_SITE_OTHER): Payer: PRIVATE HEALTH INSURANCE | Admitting: Obstetrics & Gynecology

## 2018-08-15 ENCOUNTER — Encounter (INDEPENDENT_AMBULATORY_CARE_PROVIDER_SITE_OTHER): Payer: Self-pay | Admitting: Obstetrics & Gynecology

## 2018-08-15 ENCOUNTER — Encounter (INDEPENDENT_AMBULATORY_CARE_PROVIDER_SITE_OTHER): Payer: PRIVATE HEALTH INSURANCE | Admitting: Obstetrics & Gynecology

## 2018-08-15 VITALS — BP 112/72 | HR 94 | Temp 100.0°F | Resp 14 | Ht 60.0 in | Wt 134.7 lb

## 2018-08-15 DIAGNOSIS — O4690 Antepartum hemorrhage, unspecified, unspecified trimester: Secondary | ICD-10-CM

## 2018-08-15 DIAGNOSIS — Z113 Encounter for screening for infections with a predominantly sexual mode of transmission: Secondary | ICD-10-CM

## 2018-08-15 LAB — BLOOD TYPE: ABO/Rh: O NEG

## 2018-08-15 LAB — PREGNANCY (HCG), SERUM, QUANT: Pregnancy (HCG), SRM: 28 m[IU]/mL — ABNORMAL HIGH (ref <6)

## 2018-08-15 LAB — TSH WITH REFLEXIVE FREE T4: TSH with Reflexive Free T4: 2.658 u[IU]/mL (ref 0.400–5.000)

## 2018-08-15 NOTE — Progress Notes (Signed)
NEW GYN VISIT    CC:  28 year old [id number] F @ 8369w6d by LMP who presents for vaginal bleeding in pregnancy.      HPP:   Here with partner Jill AlexandersJustin.     Had a +HPT last week some time.   Around Monday last week, started having dark brown spotting, cramping a bit as well, that resolved.   On Saturday morning, had heavy bleeding after intercourse. Was changing panty liners initially and changing often. Changed to normal pads and never saturated but changing every 1-2 hours, maybe half soaked. Having cramping at that time as well. Tapered off yesterday and a tiny bit with wiping today.     Unsure about Rh status   TSH 1.8 (04/2018)     Review of Dates:  LMP 06/28/2018 (Sure) --> EDD 04/04/2019  -->  EGA 3069w6d    Problem List   # Hypothyroid (Hashimoto's): Levothyroxine 50mcg   # History of ETOH Abuse   # ADHD - Previously on Adderall   # Anxiety - No meds     Past OB History    1. 2010 TAB with medication, concern for ectopic (unsure if MTX was used)     2. Current    Past GYN History   Menarche: 8  Menses: Regular, q25-32 days (averaging 27d), Lasting 5-7 days  History of STIs: None  Last pap: 10/2016 NILM   Pap history: No history of abnormal paps  Gyn surgeries: None  Prior contraception: Copper IUD      Patient Active Problem List    Diagnosis Date Noted   . 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] 12/22/2017   . History of alcohol abuse [F10.11] 12/22/2017   . Anxiety [F41.9] 10/22/2017   . Attention deficit hyperactivity disorder (ADHD), predominantly inattentive type [F90.0] 10/22/2017   . Screening for malignant neoplasm of cervix [Z12.4] 10/22/2017     Last pap 11/13/2016-repeat in 3 years     . H/O iron deficiency anemia [Z86.2] 10/22/2017   . Hypothyroidism due to Hashimoto's thyroiditis [E03.8, E06.3] 09/29/2017     Diagnosed 10/2016     . Hair thinning [L65.9] 09/29/2017   . 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  Attention deficit disorder (ADD)    . Substance abuse ([facility])    . Thyroid disease        Past Surgical History:  Past Surgical History:   Procedure Laterality Date   . NO PRIOR SURGERIES         Medications:    Current Outpatient Medications:   .  amphetamine-dextroAMPHetamine (Adderall) 15 MG tablet, Take 15 mg by mouth daily., Disp: , Rfl:   .  diphenhydrAMINE 25 MG capsule, Take 25 mg by mouth every 6 hours as needed., Disp: , Rfl:   .  levothyroxine 50 MCG tablet, Take 1 tablet (50 mcg) by mouth daily on an empty stomach., Disp: 90 tablet, Rfl: 1  .  Prenatal Vit-Fe Fumarate-FA (PRENATAL FORMULA) 27-1 MG Oral Tablet, Take 1 tablet by mouth daily., Disp: 100 tablet, Rfl: 3    Allergies:  Review of patient's allergies indicates:  Allergies   Allergen Reactions   . Penicillins Fever, Rash and Diarrhea/GI       Family History:    Family History     Problem (# of Occurrences) Relation (Name,Age of Onset)    Cancer (1) Mother  Depression (1) Sister    Lung Cancer (1) Mother (1)    Lupus (1) Maternal Aunt       Negative family history of: Colorectal Cancer          Social History:  Social History     Tobacco Use   . Smoking status: Former Smoker     Packs/day: 0.00     Quit date: 10/16/2013     Years since quitting: 4.8   . Smokeless tobacco: Never Used   Substance Use Topics   . Alcohol use: Yes     Frequency: Monthly or less     Drinks per session: 1 or 2     Binge frequency: Never     Comment: occ   . Drug use: Yes     Types: Marijuana       ROS:     Constitutional: Positive for fatigue   Eyes: Negative    Ears, Nose, Mouth, Throat: Negative    Cardiovascular: Negative    Respiratory: Negative    Gastrointestinal: Positive for constipation.   Genitourinary: As noted in HPI above   Musculoskeletal: Negative    Skin: Positive for rash.   Neurological: Positive for headaches.   Psychiatric: Negative    Endocrine: Negative    Hematologic/Lymphatic: Negative   Allergic/Immunologic: Negative     Physical Exam:    BP  112/72   Pulse 94   Temp 100 F (37.8 C) (Temporal)   Resp 14   Ht 5' (1.524 m)   Wt 134 lb 11.2 oz (61.1 kg)   LMP 06/28/2018   SpO2 100%   BMI 26.31 kg/m   General: healthy, alert, no distress.  Cardiac: No edema or varicosities.  Respiratory: Normal respiratory effort and chest wall movement with respiration.   Abdomen: Soft, non-tender. No masses or organomegaly.   Psychiatric:   Mood/affect:  Normal.  Orientation: oriented to time, person and place  Neurologic:  Gait:  Normal.  Skin: Skin color, texture, turgor normal. No rashes or concerning lesions on visible areas.  Pelvic Exam: External genitalia normal with blood staining, normal bartholin/skene/urethral meatus/anus, Vagina is rugated and well-estrogenized with small amount of blood in vaginal vault (coming from cervix), cervix normal in appearance without lesions. On bimanual: No CMT, no bladder tenderness, uterus normal size, shape, and consistency, no adnexal masses or tenderness. A vaginal swab for GC/CT was collected.    Transvaginal ultrasound:   Uterus anteverted, No GS seen, EMS thing 3.8mm    No adnexal abnormalities noted.  (See scanned images)     A&P: 28 year old [id number] female at [redacted]w[redacted]d by LMP here with VB c/w complete miscarriage.     # Vaginal Bleeding: Heavy vaginal bleeding now tapered, overall c/w complete miscarriage given US findings today. Will plan to trend BhCG to confirm given gestational age and history of possible ectopic.    -Reviewed ectopic precautions   -Labs: Rh, Serum BhCG    # Hypothyroid: TSH today, discussed goal of <2.5 during pregnancy and preconception.     # ADHD: Previously on adderall, plans to discontinue this with + HPT in the future.      Follow-up planned: Results via eCare

## 2018-08-15 NOTE — Telephone Encounter (Signed)
Pt called back. Reports 08/13/2018 had moderate amount of bright red bleeding with dime size clots. VB lightened up on Sunday and by the evening bleeding as well as abd cramping had stopped.    06/28/2018, 6 weeks 6 days.     Desired preg.     Will come to clinic @ 1040 for new OB with FOB. Instructed to wear mask and only come if COVID symptom free.

## 2018-08-15 NOTE — Telephone Encounter (Signed)
Called pt to offer 1040 appointment today and further triage VB/abd cramping. No answer. VM left to call RN @ [phone] or [phone] option 2.

## 2018-08-15 NOTE — Progress Notes (Deleted)
NEW GYN VISIT    CC:  28 year old G***P*** F at *** by LMP who presents for vaginal bleeding in pregnancy.      HPP:   ***     [ ]  Rh, TSH, Hct     Pregnancy Sx: ***   Bleeding: ***   Recent Travel: ***   Chicken Pox: ***    Review of Dates:  LMP 07/28/2018   --> EDD ***  -->  EGA ***  Ultrasound  *** --->  EDD ***  --> EGA  ***     Problem List   # Hypothyroid (Hashimoto's): Levothyroxine 50mcg   # History of ETOH Abuse   # ADHD - Adderall  # Anxiety     Immunizations:   Influenza _  Tdap _    Past OB History    1. ***    2. ***    Past GYN History   Menarche: ***  Menses: Regular / Irregular, q*** days, Lasting *** days  History of STIs: { :99999021::"None"}  Last pap: 10/2016 NILM   Pap history: { :101212::"no history of abnormal paps"}  Gyn surgeries: { :99999021::"None"}  Prior contraception: ***     Patient Active Problem List    Diagnosis Date Noted   . 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] 12/22/2017   . History of alcohol abuse [F10.11] 12/22/2017   . Anxiety [F41.9] 10/22/2017   . Attention deficit hyperactivity disorder (ADHD), predominantly inattentive type [F90.0] 10/22/2017   . Screening for malignant neoplasm of cervix [Z12.4] 10/22/2017     Last pap 11/13/2016-repeat in 3 years     . H/O iron deficiency anemia [Z86.2] 10/22/2017   . Hypothyroidism due to Hashimoto's thyroiditis [E03.8, E06.3] 09/29/2017     Diagnosed 10/2016     . Hair thinning [L65.9] 09/29/2017   . 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 Attention deficit disorder (ADD)    . Substance abuse (HCC)    . Thyroid disease        Past Surgical History:  Past Surgical History:   Procedure Laterality Date   . TX ECTOPIC PREGNANCY W/O SALPING&/OOPHORECTOMY         Medications:    Current Outpatient Medications:   .  amphetamine-dextroAMPHetamine (Adderall) 15 MG tablet, Take 15 mg by mouth daily., Disp: , Rfl:   .  hydrocortisone (Proctosol HC)  2.5 % rectal cream, Apply on affected area BID until resolved, Disp: 1 Tube, Rfl: 1  .  levothyroxine 50 MCG tablet, Take 1 tablet (50 mcg) by mouth daily on an empty stomach., Disp: 90 tablet, Rfl: 1  .  Prenatal Vit-Fe Fumarate-FA (PRENATAL FORMULA) 27-1 MG Oral Tablet, Take 1 tablet by mouth daily., Disp: 100 tablet, Rfl: 3    Allergies:  Review of patient's allergies indicates:  Allergies   Allergen Reactions   . Penicillins Fever, Rash and Diarrhea/GI       Family History:    Family History     Problem (# of Occurrences) Relation (Name,Age of Onset)    Cancer (1) Mother    Depression (1) Sister    Lung Cancer (1) Mother ([id number])    Lupus (1) Maternal Aunt       Negative family history of: Colorectal Cancer          Inherited or Genetic Conditions: ***   Family History of DM: ***  Cystic Fibrosis:  European Caucasians and [place] European Jews  [place], [place], Familial Dysautonomia: [place] European Jews, Ashkenazi Jews   Sickle Cell, Thalassemia: AA, [place] ([place], [place], [place]), [place], CF: [place]     Social History:  Social History     Tobacco Use   . Smoking status: Former Smoker     Packs/day: 0.00     Quit date: 10/16/2013     Years since quitting: 4.8   . Smokeless tobacco: Never Used   Substance Use Topics   . Alcohol use: Yes     Frequency: Monthly or less     Drinks per session: 1 or 2     Binge frequency: Never     Comment: occ   . Drug use: Yes     Types: Marijuana     Employment: ***  Marital status: ***   Partner Name: *** Partner Employment:       ROS:     Constitutional: {ROS - CONSTITUTIONAL:102793::"Negative "}   Eyes: {HCFAEYE:103488::"Negative "}   Ears, Nose, Mouth, Throat: {ROS - EARS/NOSE/THROAT:103489::"Negative "}   Cardiovascular: {ROS - CARDIOVASCULAR:103490::"Negative "}   Respiratory: {ROS - RESPIRATORY:103491::"Negative "}   Gastrointestinal: {ROS - GASTROENTESTINAL:103492::"Negative"}   Genitourinary: {ROS -  GENITOURINARY:103493::"Negative"}   Musculoskeletal: {ROS - MUSCULOSKELETAL:103501::"Negative "}   Skin: {ROS - SKIN:103502::"Negative "}   Neurological: {ROS - NEUROLOGICAL:103505::"Negative "}   Psychiatric: {ROS - PSYCH:103504::"Negative "}   Endocrine: {ROS - ENDOCRINE:103902::"Negative "}   Hematologic/Lymphatic: {ROS - HEMATOLOGIC/LYMPHATIC:103506::"Negative"}   Allergic/Immunologic: {ROS - ALLERGIC/IMMUNOLOGIC:103507::"Negative "}    Physical Exam:    There were no vitals taken for this visit.  General: {GENERAL APPEARANCE:50::"healthy","alert","no distress"}.  Neck: {NECK EXAM:304::"Supple, no adenopathy, thyroid symmetric, normal size, without nodules"}.  Breasts:  Inspection:  {BREASTS - INSPECTION:103735::"Normal breast symmetry,  normal nipples without discharge.No skin lesions."} Palpation: {BREAST - PALPATION:103736::"Normal breast tissue bilaterally, no tenderness, no suspicious masses."} Axillary nodes: {LYMPHATIC - AXILLARY NODES:103718::"No adenopathy."}  Cardiac: {CARDIAC - PEDAL VASCULATURE:103704::"Normal pulses in the lower extremities with normal capillary refill"}, {CARDIAC - EXTREMITIES:103705::"No edema or varicosities."}  Respiratory: {RESPIRATORY - INSPECTION:103690::"Normal respiratory effort and chest wall movement with respiration."}   Abdomen: {ABDOMEN EXAM:601::"Soft, non-tender. BS normal. No masses or organomegaly."}   Psychiatric:   Mood/affect:  {PSYCHIATRIC - MOOD/AFFECT:103732::"Normal."}  Orientation: {ORIENTATION:103408::"oriented to time, person and place"}  Neurologic:  Gait:  {MUSCULOSKELETAL - GAIT AND STATION:103720::"Normal."}  Skin: {SKIN EXAM:101::"Skin color, texture, turgor normal. No rashes or concerning lesions"} on visible areas.  Nodes: Inguinal areas: {LYMPHATIC  - INGUINAL BMWUX:324401::"UU adenopathy."}  Pelvic Exam: {PELVIC:709::"External genitalia normal","normal bartholin/skene/urethral meatus/anus.","Vagina is rugated and well-estrogenized","cervix normal  in appearance","no CMT","no bladder tenderness","uterus normal size, shape, and consistency","no adnexal masses or tenderness"}. ***A pap smear was performed.  ***A vaginal swab for GC/CT was collected.     Transvaginal ultrasound: Single intrauterine pregnancy confirmed.  CRL  ***mm  Consistent with ***w***d   *** fetal cardiac motion.    No adnexal abnormalities noted.  (See scanned images)     A&P: 28 year old G***P*** female at *** by *** here for new OB visit.    # OB   -IUP Confirmed: ***  -Prenatal labs ***   -Continue prenatal vitamins. Chicken Pox: ***  -Pre-pregnancy BMI: There is no height or weight on file to calculate BMI., recommended *** pounds of weight gain this pregnancy     # FWB: Fetus with dating by LMP, consistent with *** wk ultrasound.     # Prenatal Screening for aneuploidy and  NTD explained. Discussed options for prenatal diagnosis including both non-invasive and invasive screening. Discussed and offered carrier screening.   Given written information today to call and confirm insurance coverage.   -Maternal age related aneuploidy risk: ***   -Patient would like: ***  -CF testing: ***     # Prenatal Teaching:  -NOB class offered ***   -NOB information provided   -Counseled regarding PNV ( folic acid), regular exercise, no smoking, no alcohol, signs of miscarriage, avoid raw and undercooked meat / fish, avoid cleaning litter box     Follow-up planned:  ~4weeks   At Next Visit: ***

## 2018-08-16 LAB — GC&CHLAM NUCLEIC ACID DETECTN
Chlam Trachomatis Nucleic Acid: NEGATIVE
N.Gonorrhoeae(GC) Nucleic Acid: NEGATIVE

## 2018-09-05 ENCOUNTER — Encounter (INDEPENDENT_AMBULATORY_CARE_PROVIDER_SITE_OTHER): Payer: Self-pay | Admitting: Family Medicine

## 2018-09-05 NOTE — Telephone Encounter (Signed)
Spoke with pt and scheduled telemedicine on Tuesday 7/7.    Closing TE - no further action needed.

## 2018-09-05 NOTE — Telephone Encounter (Signed)
Routing to Front Desk.    Please contact patient to schedule to discuss with provider    Thank you.    FYI- routing to provider as update.    Thank you.

## 2018-09-06 ENCOUNTER — Telehealth (INDEPENDENT_AMBULATORY_CARE_PROVIDER_SITE_OTHER): Payer: PRIVATE HEALTH INSURANCE | Admitting: Family Medicine

## 2018-09-06 DIAGNOSIS — L509 Urticaria, unspecified: Secondary | ICD-10-CM

## 2018-09-06 DIAGNOSIS — Z8759 Personal history of other complications of pregnancy, childbirth and the puerperium: Secondary | ICD-10-CM

## 2018-09-06 DIAGNOSIS — E038 Other specified hypothyroidism: Secondary | ICD-10-CM

## 2018-09-06 DIAGNOSIS — R1084 Generalized abdominal pain: Secondary | ICD-10-CM

## 2018-09-06 DIAGNOSIS — E063 Autoimmune thyroiditis: Secondary | ICD-10-CM

## 2018-09-06 MED ORDER — LEVOTHYROXINE SODIUM 50 MCG OR TABS
50.0000 ug | ORAL_TABLET | Freq: Every day | ORAL | 6 refills | Status: DC
Start: 2018-09-06 — End: 2019-05-02

## 2018-09-06 NOTE — Progress Notes (Signed)
Distant Site Telemedicine Encounter     I conducted this encounter from [facility] via secure, live, face-to-face video conference with the patient. Meagan was located at [place] in [place].  Prior to the interview, the risks and benefits of telemedicine were discussed with the patient and verbal consent was obtained.    This visit was remote in response to the CDC guidelines to minimize exposure to COVID 19 infection    CC:    Chief Complaint   Patient presents with   . Thyroid Problem       HPI  Meagan Coffey is a 28 year old female with Hashimoto's thyroiditis and recent misscarriage who has the following concerns:    -miscarriage at 7 weeks in June. No complications (reviewed notes from Dr Durenda AgeAndersen).  She is doing better but having a hard time sleeping at night.  Stress at work and at a personal level.  [employer] has not contract with [facility] and patient might need to change providers.  She is very sad about this.  Not sure is she needs to change the dose of the Levothyroxine and also if she should switch to Armour.  Has had "symptoms" mostly related to tiredness.    Results for orders placed or performed in visit on 08/15/18   BLOOD TYPE   Result Value Ref Range    ABO/Rh O NEGATIVE    Pregnancy (HCG), Quant, Serum   Result Value Ref Range    Pregnancy (HCG), SRM 28 (H) <6 m[IU]/mL   TSH with Reflexive Free T4   Result Value Ref Range    TSH with Reflexive Free T4 2.658 0.400 - 5.000 u[IU]/mL   GC and Chlamydia Nucleic Acid Testing   Result Value Ref Range    GC&Chlam NA Spec Desc Vaginal Specimen     Chlam Trachomatis Nucleic Acid Negative NRN    N.Gonorrhoeae(GC) Nucleic Acid Negative NRN       -having more GI symptoms.  Cramping, bloated, generalized Abd pain  She knows to be on a low FODMAP diet.  GI has been consulted and was told to be related to IBS.  Patient not consistent with her diet.    -a fewer days before the pregnancy test was positive, she broke in a rash and had lesions and  hives.  Could not sleep and at a local UC was diagnosed with [place].  She was taking Benadryl but the rash took a long time to fade.  Almost resolved now.      The following portions of the patient's history were reviewed with the patient and updated as appropriate: problem list, current medications, allergies, past medical history, past surgical history, past social history and past family history    Past Medical History  Past Medical History:   Diagnosis Date   . Attention deficit disorder (ADD)    . Substance abuse (HCC)    . Thyroid disease        Past Surgical History  Past Surgical History:   Procedure Laterality Date   . NO PRIOR SURGERIES         Allergies  Review of patient's allergies indicates:  Allergies   Allergen Reactions   . Penicillins Fever, Rash and Diarrhea/GI       Medications  Current Outpatient Medications   Medication Sig Dispense Refill   . amphetamine-dextroAMPHetamine (Adderall) 15 MG tablet Take 15 mg by mouth daily.     . diphenhydrAMINE 25 MG capsule Take 25 mg by mouth  every 6 hours as needed.     Marland Kitchen levothyroxine 50 MCG tablet Take 1 tablet (50 mcg) by mouth daily on an empty stomach. 30 tablet 6   . Prenatal Vit-Fe Fumarate-FA (PRENATAL FORMULA) 27-1 MG Oral Tablet Take 1 tablet by mouth daily. 100 tablet 3     No current facility-administered medications for this visit.           Family History     Problem (# of Occurrences) Relation (Name,Age of Onset)    Cancer (1) Mother    Depression (1) Sister    Lung Cancer (1) Mother (98)    Lupus (1) Maternal Aunt       Negative family history of: Colorectal Cancer          Social History     Tobacco Use   . Smoking status: Former Smoker     Packs/day: 0.00     Quit date: 10/16/2013     Years since quitting: 4.8   . Smokeless tobacco: Never Used   Substance Use Topics   . Alcohol use: Yes     Frequency: Monthly or less     Drinks per session: 1 or 2     Binge frequency: Never     Comment: occ   . Drug use: Yes     Types: Marijuana       ROS:  per HPI    Physical Exam  There were no vitals taken for this visit. There is no height or weight on file to calculate BMI.  General appearance: healthy, alert, no distress, relaxed, cooperative, oriented x 3, tearful at times.  Head:normocephalic and atraumatic  Eyes: EOMI  Neuro: grossly normal on inspection during interaction.  Psych: normal speech, affect and behavior.      Assessment/Plan    1. Hypothyroidism due to Hashimoto's thyroiditis  TSH within range. I don't recommend to increase the dose of levoxyl.  Suspecting part of the problem might be BH related.  I don't recommend armour due to the lack of standardization.  - levothyroxine 50 MCG tablet; Take 1 tablet (50 mcg) by mouth daily on an empty stomach.  Dispense: 30 tablet; Refill: 6    2. H/O miscarriage, not currently pregnant  Patient is recovering well.  Consider follow up if needed from the grief stand point    3. Urticaria  Resolving, follow up if not better    4. Generalized abdominal pain  Suspecting IBS related.  Consider low FODMAP diet and close follow up.  Consider probiotics and daily soluble fiber.      I spent a total time of 20  minutes face-to-face with the patient, of which more than 50% was spent counseling and coordinating care as outlined in this note including preliminary diagnosis, treatment options and follow up plans.      Jonelle Sidle, MD  [place]  .

## 2018-09-07 NOTE — Patient Instructions (Signed)
Dear Meagan Coffey,    It was a pleasure to meet you today. Thank you for coming to see me today!    If medications were prescribed, please read the information your pharmacist will provide you about the medication, its indications and potential side effects.  Please ask any questions you may have before taking the medication.         Today we dicussed:  1. Hypothyroidism due to Hashimoto's thyroiditis  Please continue same dose and follow up in 3 month.  If symptoms continue-consider endocrine evaluation  Please exercise daily and have a regular sleep regimen.  Best to be sleeping by 10 PM if able.  Let me know if not better  - levothyroxine 50 MCG tablet; Take 1 tablet (50 mcg) by mouth daily on an empty stomach.  Dispense: 30 tablet; Refill: 6    2. H/O miscarriage, not currently pregnant  Please follow up if concerns about this and you go through this healing process    3. Urticaria  If not resolving or if flaring up again, please let me know    4. Generalized abdominal pain  Suspecting related to IBS.  Try low FODMAP diet         If you feel your symptoms are ever life threatening, please call 911 or go to the nearest emergency department. If you have any questions, please feel free to call the clinic at [phone].  For non urgent matters, please feel free to message me though ecare.    Sincerely,    Jonelle Sidle, MD

## 2018-09-08 ENCOUNTER — Encounter (INDEPENDENT_AMBULATORY_CARE_PROVIDER_SITE_OTHER): Payer: PRIVATE HEALTH INSURANCE | Admitting: Obstetrics & Gynecology

## 2018-11-01 ENCOUNTER — Encounter (INDEPENDENT_AMBULATORY_CARE_PROVIDER_SITE_OTHER): Payer: Self-pay

## 2018-11-29 ENCOUNTER — Telehealth (INDEPENDENT_AMBULATORY_CARE_PROVIDER_SITE_OTHER): Payer: Self-pay | Admitting: Family Medicine

## 2018-11-29 NOTE — Telephone Encounter (Signed)
Patient called via option 2.    Patient is in between psychiatrists at the moment and wonders if Dr. Annalee Genta will prescribe their ADHD medication. Will send in records from previous psychiatrist. Gave fax number to patient to have records faxed.    Routing to provider to see if that is okay.    FYI to FD to place records in provider bin.

## 2018-12-01 NOTE — Telephone Encounter (Signed)
Please let me know when the records are in

## 2018-12-19 ENCOUNTER — Other Ambulatory Visit: Payer: Self-pay

## 2018-12-20 ENCOUNTER — Encounter (HOSPITAL_COMMUNITY): Payer: Self-pay | Admitting: *Deleted

## 2018-12-20 DIAGNOSIS — Z883 Allergy status to other anti-infective agents status: Secondary | ICD-10-CM | POA: Insufficient documentation

## 2018-12-20 DIAGNOSIS — W133XXD Fall through floor, subsequent encounter: Secondary | ICD-10-CM | POA: Diagnosis not present

## 2018-12-20 DIAGNOSIS — Z79899 Other long term (current) drug therapy: Secondary | ICD-10-CM | POA: Diagnosis not present

## 2018-12-20 DIAGNOSIS — L03116 Cellulitis of left lower limb: Secondary | ICD-10-CM | POA: Insufficient documentation

## 2018-12-20 DIAGNOSIS — S80812D Abrasion, left lower leg, subsequent encounter: Secondary | ICD-10-CM | POA: Diagnosis present

## 2018-12-20 NOTE — ED Triage Notes (Signed)
Pt injured her left leg 10/10 on grandmothers deck, has been to UC twice for different antibiotics, she feels it is not improving.Redness noted on pictures,

## 2018-12-21 ENCOUNTER — Emergency Department (HOSPITAL_COMMUNITY): Payer: BC Managed Care – PPO

## 2018-12-21 ENCOUNTER — Emergency Department (HOSPITAL_COMMUNITY)
Admission: EM | Admit: 2018-12-21 | Discharge: 2018-12-21 | Disposition: A | Discharge status: Home / Self Care | Payer: BC Managed Care – PPO | Attending: Emergency Medicine | Admitting: Emergency Medicine

## 2018-12-21 DIAGNOSIS — L03116 Cellulitis of left lower limb: Secondary | ICD-10-CM

## 2018-12-21 DIAGNOSIS — Z881 Allergy status to other antibiotic agents status: Secondary | ICD-10-CM

## 2018-12-21 DIAGNOSIS — Z883 Allergy status to other anti-infective agents status: Secondary | ICD-10-CM

## 2018-12-21 LAB — CBC WITH DIFFERENTIAL/PLATELET
Abs Immature Granulocytes: 0.03 10*3/uL (ref 0.00–0.07)
Basophils Absolute: 0 10*3/uL (ref 0.0–0.1)
Basophils Relative: 0 %
Eosinophils Absolute: 0.2 10*3/uL (ref 0.0–0.5)
Eosinophils Relative: 2 %
HCT: 41.6 % (ref 36.0–46.0)
Hemoglobin: 13.3 g/dL (ref 12.0–15.0)
Immature Granulocytes: 0 %
Lymphocytes Relative: 24 %
Lymphs Abs: 2.7 10*3/uL (ref 0.7–4.0)
MCH: 26.2 pg (ref 26.0–34.0)
MCHC: 32 g/dL (ref 30.0–36.0)
MCV: 82.1 fL (ref 80.0–100.0)
Monocytes Absolute: 0.9 10*3/uL (ref 0.1–1.0)
Monocytes Relative: 8 %
Neutro Abs: 7.4 10*3/uL (ref 1.7–7.7)
Neutrophils Relative %: 66 %
Platelets: 264 10*3/uL (ref 150–400)
RBC: 5.07 MIL/uL (ref 3.87–5.11)
RDW: 15.4 % (ref 11.5–15.5)
WBC: 11.3 10*3/uL — ABNORMAL HIGH (ref 4.0–10.5)
nRBC: 0 % (ref 0.0–0.2)

## 2018-12-21 LAB — BASIC METABOLIC PANEL
Anion gap: 9 (ref 5–15)
BUN: 13 mg/dL (ref 6–20)
CO2: 23 mmol/L (ref 22–32)
Calcium: 9.3 mg/dL (ref 8.9–10.3)
Chloride: 103 mmol/L (ref 98–111)
Creatinine, Ser: 1.14 mg/dL — ABNORMAL HIGH (ref 0.44–1.00)
GFR calc Af Amer: >60 mL/min (ref >60)
GFR calc non Af Amer: >60 mL/min (ref >60)
Glucose, Bld: 92 mg/dL (ref 70–99)
Potassium: 4.3 mmol/L (ref 3.5–5.1)
Sodium: 135 mmol/L (ref 135–145)

## 2018-12-21 MED ORDER — NAPROXEN 500 MG PO TABS: 500.0000 mg | ORAL_TABLET | Freq: Two times a day (BID) | ORAL | 0 refills | Status: AC

## 2018-12-21 MED ORDER — HYDROCODONE-ACETAMINOPHEN 5-325 MG PO TABS
1.0000 | ORAL_TABLET | Freq: Once | ORAL | Status: AC
Start: 2018-12-21 — End: 2018-12-21
  Administered 2018-12-21: 1 via ORAL
  Filled 2018-12-21: qty 1

## 2018-12-21 MED ORDER — BACITRACIN ZINC 500 UNIT/GM EX OINT
TOPICAL_OINTMENT | Freq: Two times a day (BID) | CUTANEOUS | Status: DC
  Administered 2018-12-21: 1 via TOPICAL
  Filled 2018-12-21: qty 0.9

## 2018-12-21 NOTE — ED Notes (Signed)
Pt states her last tetanus shot was 12/17/18.

## 2018-12-21 NOTE — Discharge Instructions (Addendum)
You were seen today for increasing redness and pain to your prior abrasion sites.  I suspect some of the redness is related to neomycin allergy.  Avoid Neosporin.  Use bacitracin topically.  Continue Bactrim and Keflex.  Your work-up is otherwise reassuring.

## 2018-12-21 NOTE — ED Provider Notes (Signed)
[facility]-EMERGENCY DEPT Provider Note   CSN: [id number] Arrival date & time: 12/20/18  1834     History   Chief Complaint Chief Complaint  Patient presents with  . Wound Infection    HPI Stacey Buckley is a 28 y.o. female.     HPI  This is a 28 year old female who presents with concerns for wound infection.  Patient reports that she fell through her grandmothers porch on 12/10/2018.  She noted abrasions to the left lower extremity.  Since that time she has noted increasing redness.  She was seen in urgent care on Saturday and provided with Bactrim.  She states that she did not improve.  She subsequently was seen again on Monday and started Keflex in addition to Bactrim and still reports no significant improvement.  No fevers.  She feels like the redness has increased.  Initially she was using Neosporin but was told to stop this.  She is taking ibuprofen and Tylenol at home with minimal relief.  She currently rates her pain at 9 out of 10.  She denies any numbness or tingling in the foot.  Denies other significant injury.  History reviewed. No pertinent past medical history.  There are no active problems to display for this patient.   Past Surgical History:  Procedure Laterality Date  . DILATION AND CURETTAGE OF UTERUS       OB History   No obstetric history on file.      Home Medications    Prior to Admission medications   Medication Sig Start Date End Date Taking? Authorizing Provider  cephALEXin (KEFLEX) 500 MG capsule Take 500 mg by mouth 4 (four) times daily.   Yes [provider]  ibuprofen (ADVIL,MOTRIN) 600 MG tablet Take 1 tablet (600 mg total) by mouth every 6 (six) hours as needed. Patient taking differently: Take 400 mg by mouth every 6 (six) hours as needed for moderate pain.  10/07/17  Yes Maczis, Elmer Sow, PA-C  sulfamethoxazole-trimethoprim (BACTRIM DS) 800-160 MG tablet Take 1 tablet by mouth 2 (two) times daily.   Yes  [provider]  naproxen (NAPROSYN) 500 MG tablet Take 1 tablet (500 mg total) by mouth 2 (two) times daily. 12/21/18   Murdis Flitton, Mayer Masker, MD  ondansetron (ZOFRAN) 4 MG tablet Take 1 tablet (4 mg total) by mouth every 8 (eight) hours as needed for nausea or vomiting. Patient not taking: Reported on 12/21/2018 10/07/17   Maczis, Elmer Sow, PA-C    Family History No family history on file.  Social History Social History   Tobacco Use  . Smoking status: Never Smoker  . Smokeless tobacco: Never Used  Substance Use Topics  . Alcohol use: Yes  . Drug use: Yes    Types: Marijuana     Allergies   Patient has no known allergies.   Review of Systems Review of Systems  Constitutional: Negative for fever.  Respiratory: Negative for shortness of breath.   Cardiovascular: Negative for chest pain.  Gastrointestinal: Negative for abdominal pain.  Skin: Positive for wound.  All other systems reviewed and are negative.    Physical Exam Updated Vital Signs BP 103/72 (BP Location: Left Arm)   Pulse 64   Temp 98.4 F (36.9 C) (Oral)   Resp 18   Ht 1.676 m (5\' 6" )   Wt 85.3 kg   LMP 11/18/2018   SpO2 99%   BMI 30.34 kg/m   Physical Exam Vitals signs and nursing note reviewed.  Constitutional:  Appearance: She is well-developed.  HENT:     Head: Normocephalic and atraumatic.  Eyes:     Pupils: Pupils are equal, round, and reactive to light.  Cardiovascular:     Rate and Rhythm: Normal rate and regular rhythm.     Heart sounds: Normal heart sounds.  Pulmonary:     Effort: Pulmonary effort is normal. No respiratory distress.     Breath sounds: Wheezing present.  Abdominal:     Palpations: Abdomen is soft.     Tenderness: There is no abdominal tenderness.  Musculoskeletal:     Comments: Tenderness palpation of the anterior shin  Skin:    General: Skin is warm and dry.     Comments: Patient with abrasions just inferior to the knee of the left lower extremity,  2 separate abrasions, no significant oozing noted, there is surrounding erythema that is nonblanching  Neurological:     Mental Status: She is alert and oriented to person, place, and time.  Psychiatric:        Mood and Affect: Mood normal.      ED Treatments / Results  Labs (all labs ordered are listed, but only abnormal results are displayed) Labs Reviewed  CBC WITH DIFFERENTIAL/PLATELET - Abnormal; Notable for the following components:      Result Value   WBC 11.3 (*)    All other components within normal limits  BASIC METABOLIC PANEL - Abnormal; Notable for the following components:   Creatinine, Ser 1.14 (*)    All other components within normal limits    EKG None  Radiology Dg Tibia/fibula Left  Result Date: 12/21/2018 CLINICAL DATA:  Wound infection EXAM: LEFT TIBIA AND FIBULA - 2 VIEW COMPARISON:  None. FINDINGS: There is no evidence of fracture or other focal bone lesions. Mild soft tissue swelling seen over the pretibial lower extremity. No subcutaneous emphysema. IMPRESSION: No acute osseous abnormality. Electronically Signed   By: Prudencio Pair M.D.   On: 12/21/2018 02:19    Procedures Procedures (including critical care time)  Medications Ordered in ED Medications  bacitracin ointment (1 application Topical Given 12/21/18 0144)  HYDROcodone-acetaminophen (NORCO/VICODIN) 5-325 MG per tablet 1 tablet (1 tablet Oral Given 12/21/18 0144)     Initial Impression / Assessment and Plan / ED Course  I have reviewed the triage vital signs and the nursing notes.  Pertinent labs & imaging results that were available during my care of the patient were reviewed by me and considered in my medical decision making (see chart for details).        Patient presents with pain and abrasions with increasing redness of the left lower extremity.  Overall nontoxic and vital signs are reassuring.  She is currently on Bactrim and Keflex which has appropriate MRSA coverage.  The  erythema is nonblanching.  Highly suspect this may be related to recent Neosporin use and a local reaction to Neosporin rather than ongoing or worsening cellulitis.  There is no associated crepitus.  Will obtain CBC, BMP, and x-rays to evaluate for gas.  Patient was given Norco for pain.  3:16 AM Lab work reviewed and largely reassuring.  Recommend continuation of Bactrim and Keflex.  Avoid neomycin-containing topical antibiotic.  Naproxen as needed for pain.  Final Clinical Impressions(s) / ED Diagnoses   Final diagnoses:  Cellulitis of left lower extremity  Allergy to Neosporin    ED Discharge Orders         Ordered    naproxen (NAPROSYN) 500 MG tablet  2 times daily     12/21/18 0314           Shon BatonHorton, Tahjae Durr F, MD 12/21/18 [phone]

## 2018-12-21 NOTE — ED Notes (Signed)
Pt was verbalized discharge instructions. Pt had no further questions at this time. NAD. 

## 2018-12-29 ENCOUNTER — Encounter (INDEPENDENT_AMBULATORY_CARE_PROVIDER_SITE_OTHER): Payer: PRIVATE HEALTH INSURANCE | Admitting: Family Medicine

## 2018-12-30 ENCOUNTER — Encounter (INDEPENDENT_AMBULATORY_CARE_PROVIDER_SITE_OTHER): Payer: Self-pay | Admitting: Family Medicine

## 2018-12-30 ENCOUNTER — Ambulatory Visit (INDEPENDENT_AMBULATORY_CARE_PROVIDER_SITE_OTHER): Payer: PRIVATE HEALTH INSURANCE | Admitting: Family Medicine

## 2018-12-30 VITALS — BP 120/80 | HR 74 | Temp 98.1°F | Resp 16 | Ht 60.5 in | Wt 134.8 lb

## 2018-12-30 DIAGNOSIS — F909 Attention-deficit hyperactivity disorder, unspecified type: Secondary | ICD-10-CM

## 2018-12-30 DIAGNOSIS — F4010 Social phobia, unspecified: Secondary | ICD-10-CM

## 2018-12-30 DIAGNOSIS — L509 Urticaria, unspecified: Secondary | ICD-10-CM | POA: Insufficient documentation

## 2018-12-30 DIAGNOSIS — Z1159 Encounter for screening for other viral diseases: Secondary | ICD-10-CM

## 2018-12-30 DIAGNOSIS — E063 Autoimmune thyroiditis: Secondary | ICD-10-CM

## 2018-12-30 DIAGNOSIS — Z3169 Encounter for other general counseling and advice on procreation: Secondary | ICD-10-CM

## 2018-12-30 DIAGNOSIS — E038 Other specified hypothyroidism: Secondary | ICD-10-CM

## 2018-12-30 MED ORDER — PROPRANOLOL HCL 10 MG OR TABS
ORAL_TABLET | ORAL | 1 refills | Status: DC
Start: 2018-12-30 — End: 2020-01-11

## 2018-12-30 NOTE — Progress Notes (Signed)
Chief Complaint   Patient presents with   . 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 [facility] and lives closer to this clinic so wants to establish care here. She does not need a wellness exam. Has a few issues to discuss.    1) Hashimotos thyroiditis: diagnosed in 2018. Had fatigue at a routine wellness and diagnosed and started meds right away. She has weight gain and some residual fatigue. Last TSH (at the time she was pregnant) was 2.6. Would like to check today. Dose is 50mcg daily.    2) anxiety d/o : she was seeing a Advertising account plannerpsych ARNP ([facility]) and a therapist for about a year. Now switched to another provider and had appt 2 weeks ago. She thinks Taneya may have bipolar 2 d/o and may want to try antipsychotics. She is not comfortable with this plan. She would like help finding a new psychiatrist for diagnosis clarification and med management. She uses propranolol PRN speeches/presentation for her social anxiety and needs this refills.    3) ADHD: [place] ADHD in [place] a year ago. Started ritalin but this gave her diarrhea. [facility] changed her to adderall (made her anxiety really bad) and now for the last 51mo she is on dexedrine: XR 15mg  in AM and 7.5mg  dexedrine IR at 4pm.  Not as bad for her anxiety.    4) FERTILITY   She had her IUD removed late sept 2019. They started timed intercourse pretty early on and got pregnant June 2020. Miscarriage at about 7 weeks. Asks if she should see GYN back or seek fertility workup.    I reviewed the patient's recorded medical history and confirmed the medications, problem list and allergies.    ROS    Objective:   Vitals: BP 120/80   Pulse 74   Temp 98.1 F (36.7 C) (Temporal)   Resp 16   Ht 5' 0.5" (1.537 m)   Wt 134 lb 12.8 oz  (61.1 kg)   LMP 12/06/2018   SpO2 100%   BMI 25.89 kg/m   Physical Exam  Gen: well groomed. Anxious and slightly fidgety. Fair historian    Assessment and Plan:   Hypothyroidism due to Hashimoto's thyroiditis  Recheck TSH  Goal is < 2.5 as she is planning conception  - TSH with Reflexive Free T4    Social anxiety disorder  Refilled  Will start referral to psychiatry and have our HN help find her a provider in network  - propranolol 10 MG tablet; 10mg  po approx 20-2630mins before anxiety provoking situation  Dispense: 30 tablet; Refill: 1  - REFERRAL TO PSYCHIATRY  - REFERRAL TO SOCIAL WORK    Attention deficit hyperactivity disorder (ADHD), unspecified ADHD type    - REFERRAL TO PSYCHIATRY  - REFERRAL TO SOCIAL WORK    Encounter for hepatitis C screening test for low risk patient    - Hepatitis C Ab with Reflex PCR      (Z31.69) Encounter for preconception consultation  Plan: she is on prenatal vitamins. She did conceive naturally within 51mo of stopping contraception and technically 2 consecutive AB would warrant a workup. Reassured for now. We could refer to GYN here at  [facility] if she prefers this. Knows to stop the stimulants as soon as pregnancy is confirmed.          Cecille Aver, MD  [facility]  [address], [place]  [place] [address]  [phone]

## 2018-12-31 LAB — HEPATITIS C AB WITH REFLEX PCR: Hepatitis C Antibody w/Rflx PCR: NONREACTIVE

## 2018-12-31 LAB — TSH WITH REFLEXIVE FREE T4: TSH with Reflexive Free T4: 2.478 u[IU]/mL (ref 0.400–5.000)

## 2019-01-02 ENCOUNTER — Telehealth (INDEPENDENT_AMBULATORY_CARE_PROVIDER_SITE_OTHER): Payer: Self-pay

## 2019-01-02 NOTE — Telephone Encounter (Signed)
First Attempt: Called Meagan Coffey and left a VM message requesting patient to call back to help find psychiatry resources.    CCR if patient calls back please direct to HN at [phone]. Thanks!

## 2019-01-04 NOTE — Telephone Encounter (Signed)
Second Attempt: Called Meagan Coffey and left a VM message requesting patient to call back to help find psychiatry resources.    CCR if patient calls back please direct to HN at [phone]. Thanks!

## 2019-01-04 NOTE — Telephone Encounter (Signed)
Incoming Call: Patient wanted resources sent through [facility].    Follow up in one week.    CCR if patient calls back please direct to HN at [phone]. Thanks!

## 2019-01-12 NOTE — Telephone Encounter (Signed)
First F/U Attempt: Called Meagan Coffey and left a VM message requesting patient to call back.     CCR if patient calls back please direct to HN at [phone]. Thanks!

## 2019-01-16 NOTE — Telephone Encounter (Signed)
Second F/U Attempt: Called Meagan Coffey and left a VM message requesting patient to call back.     CCR if patient calls back please direct to HN at [phone]. Thanks!

## 2019-01-23 NOTE — Telephone Encounter (Signed)
Third F/U Attempt: Called Meagan Coffey and left a VM message requesting patient to call back.     Closing TE and referral at this time.

## 2019-05-01 ENCOUNTER — Other Ambulatory Visit (INDEPENDENT_AMBULATORY_CARE_PROVIDER_SITE_OTHER): Payer: Self-pay | Admitting: Family Medicine

## 2019-05-01 DIAGNOSIS — E038 Other specified hypothyroidism: Secondary | ICD-10-CM

## 2019-05-02 MED ORDER — LEVOTHYROXINE SODIUM 50 MCG OR TABS
ORAL_TABLET | ORAL | 8 refills | Status: DC
Start: 2019-05-02 — End: 2020-02-03

## 2019-06-02 ENCOUNTER — Encounter (INDEPENDENT_AMBULATORY_CARE_PROVIDER_SITE_OTHER): Payer: Self-pay | Admitting: Family Medicine

## 2019-06-02 DIAGNOSIS — Z3141 Encounter for fertility testing: Secondary | ICD-10-CM

## 2019-06-02 NOTE — Telephone Encounter (Signed)
Routing to provider for advisement.    Patient requesting labs before fertility clinic appointment in June and also requesting labs for thyroid check.

## 2019-06-23 ENCOUNTER — Encounter (INDEPENDENT_AMBULATORY_CARE_PROVIDER_SITE_OTHER): Payer: Self-pay

## 2019-06-26 ENCOUNTER — Ambulatory Visit
Payer: No Typology Code available for payment source | Attending: Obstetrics & Gynecology | Admitting: Obstetrics & Gynecology

## 2019-06-26 VITALS — BP 114/65 | HR 91 | Temp 98.6°F | Ht 60.0 in | Wt 142.0 lb

## 2019-06-26 DIAGNOSIS — N979 Female infertility, unspecified: Secondary | ICD-10-CM | POA: Insufficient documentation

## 2019-06-26 DIAGNOSIS — Z3141 Encounter for fertility testing: Secondary | ICD-10-CM | POA: Insufficient documentation

## 2019-06-26 LAB — URINE PREGNANCY TEST HCG, ONSITE: Pregnancy (HCG) (UWNC), URN: NEGATIVE

## 2019-06-26 NOTE — Progress Notes (Signed)
CC: 'fertility testing'    HPI  29 yo G3P0030 presents for secondary infertility.  Had her IUD removed fall of 2019 and began having regular timed intercourse shortly thereafter.  Has only had one pregnancy that resulted in miscarriage in June 2020.      She has regular menses, q 29 days. Was taking OPKs, were always positive, but now just using app on her phone and having frequent intercourse during ovulation window.  Attempting to manage her stress with gardening and walking; aware she could lose some weight and attempting to use her treadmill more often.     Partner has Chron's and has been on immunosuppressive medication since age 54.  Currently on sulfasalazine and azathioprine.  Has not fathered any other pregnancies other than recent SAB.    ObHx  G1 ectopic, treated surgically, 2010  G2 medical TOP 2011  G3 7 week SAB 08/2018    GynHx  Hx of CT, treated 2010  No hx of abnl Pap  Contraception: none  LMP 05/27/19    PHM  Hashimoto's thyroiditis  ADHD    PSH  Ankle   T&A    FamHx  Mother died of lung cancer    SocHx  Denies tobacco or alcohol  MJ 3 times weekly    ROS  12-point ROS negative expect that mentioned in HPI    Physical Exam  Vitals:    06/26/19 0956   Temp: 37 C   Pulse: 91   BP: 114/65   Height: 5' (1.524 m)   Weight: 64.4 kg (142 lb)        Physical Exam  Constitutional:       Appearance: Normal appearance.   HENT:      Head: Normocephalic and atraumatic.   Eyes:      Extraocular Movements: Extraocular movements intact.      Conjunctiva/sclera: Conjunctivae normal.   Neck:      Musculoskeletal: Normal range of motion.   Pulmonary:      Effort: Pulmonary effort is normal.   Musculoskeletal: Normal range of motion.   Neurological:      General: No focal deficit present.      Mental Status: She is alert and oriented to person, place, and time.   Psychiatric:         Mood and Affect: Mood normal.         Behavior: Behavior normal.         Thought Content: Thought content normal.         Judgment:  Judgment normal.     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  Regular predictable menses - does not appear ovulatory in etiology.  Discussed infertility in general; counseled that not all infertility is female factor.  Discussed tubal factor as a possible etiology given her history of CT and an ectopic.  Discussed female factor in setting of systemic illness and immunosuppressive medications.    Plan:    - day 3 FSH and estradiol; will include TSH although most recent was normal   - semen analysis ordered for partner Nellene Courtois DOB 05/05/82)  - referral to [facility] placed today    Will notify patient of lab results via MyChart.

## 2019-06-29 ENCOUNTER — Encounter (HOSPITAL_BASED_OUTPATIENT_CLINIC_OR_DEPARTMENT_OTHER): Payer: Self-pay | Admitting: Obstetrics & Gynecology

## 2019-06-30 ENCOUNTER — Other Ambulatory Visit (INDEPENDENT_AMBULATORY_CARE_PROVIDER_SITE_OTHER): Payer: Self-pay | Admitting: Obstetrics & Gynecology

## 2019-06-30 ENCOUNTER — Ambulatory Visit: Payer: No Typology Code available for payment source | Attending: Obstetrics & Gynecology

## 2019-06-30 ENCOUNTER — Other Ambulatory Visit (HOSPITAL_BASED_OUTPATIENT_CLINIC_OR_DEPARTMENT_OTHER): Payer: Self-pay | Admitting: Obstetrics & Gynecology

## 2019-06-30 DIAGNOSIS — O4690 Antepartum hemorrhage, unspecified, unspecified trimester: Secondary | ICD-10-CM

## 2019-06-30 DIAGNOSIS — N979 Female infertility, unspecified: Secondary | ICD-10-CM | POA: Insufficient documentation

## 2019-06-30 LAB — PREGNANCY (HCG), SERUM, QUANT: Pregnancy (HCG), SRM: <1 m[IU]/mL (ref <6)

## 2019-06-30 LAB — ESTRADIOL: Estradiol: 45 pg/mL

## 2019-06-30 LAB — FOLLICLE STIMULATING HORMONE: Follicle Stimulating Hormone: 9 m[IU]/mL

## 2019-06-30 LAB — TSH WITH REFLEXIVE FREE T4: TSH with Reflexive Free T4: 2.536 u[IU]/mL (ref 0.400–5.000)

## 2019-07-03 ENCOUNTER — Encounter (HOSPITAL_BASED_OUTPATIENT_CLINIC_OR_DEPARTMENT_OTHER): Payer: Self-pay

## 2019-07-03 ENCOUNTER — Encounter (HOSPITAL_BASED_OUTPATIENT_CLINIC_OR_DEPARTMENT_OTHER): Payer: Self-pay | Admitting: Obstetrics & Gynecology

## 2019-07-05 ENCOUNTER — Encounter (HOSPITAL_BASED_OUTPATIENT_CLINIC_OR_DEPARTMENT_OTHER): Payer: Self-pay

## 2019-08-25 ENCOUNTER — Encounter (INDEPENDENT_AMBULATORY_CARE_PROVIDER_SITE_OTHER): Payer: Self-pay

## 2019-09-29 ENCOUNTER — Other Ambulatory Visit (INDEPENDENT_AMBULATORY_CARE_PROVIDER_SITE_OTHER): Payer: Self-pay | Admitting: Family Medicine

## 2019-10-12 ENCOUNTER — Telehealth (HOSPITAL_BASED_OUTPATIENT_CLINIC_OR_DEPARTMENT_OTHER): Payer: No Typology Code available for payment source | Admitting: Reproductive Endocrinology

## 2019-10-12 ENCOUNTER — Ambulatory Visit (HOSPITAL_BASED_OUTPATIENT_CLINIC_OR_DEPARTMENT_OTHER): Payer: No Typology Code available for payment source

## 2020-01-11 ENCOUNTER — Encounter (INDEPENDENT_AMBULATORY_CARE_PROVIDER_SITE_OTHER): Payer: Self-pay | Admitting: Family Medicine

## 2020-01-11 ENCOUNTER — Ambulatory Visit (INDEPENDENT_AMBULATORY_CARE_PROVIDER_SITE_OTHER): Payer: No Typology Code available for payment source | Admitting: Family Medicine

## 2020-01-11 VITALS — BP 97/68 | HR 75 | Temp 97.8°F | Resp 12 | Wt 135.4 lb

## 2020-01-11 DIAGNOSIS — Z23 Encounter for immunization: Secondary | ICD-10-CM

## 2020-01-11 DIAGNOSIS — N6315 Unspecified lump in the right breast, overlapping quadrants: Secondary | ICD-10-CM

## 2020-01-11 MED ORDER — VARICELLA VIRUS VACCINE LIVE 1350 PFU/0.5ML IJ SUSR
0.5000 mL | Freq: Once | INTRAMUSCULAR | Status: AC
Start: 2020-01-11 — End: 2020-01-11
  Administered 2020-01-11: 0.5 mL via SUBCUTANEOUS

## 2020-01-11 NOTE — Progress Notes (Signed)
Vaccine Screening Questions    Interpreter: No    1. Are you allergic to Latex? NO    2.  Have you had a serious reaction or an allergic reaction to a vaccine?  NO    3.  Currently have a moderate or severe illness, including fever?  NO    4.  Ever had a seizure or any neurological problem associated with a vaccine? (DTaP/TDaP/DTP pertinent) NO    5.  Is patient receiving  any live vaccinations today? (Varicella-Chickenpox, MMR-Measles/Mumps/Rubella, Zoster-Shingles, Flumist, Yellow Fever) NOTE: oral rotavirus is exempt  YES - Additional Live Vaccine Questions:                       Have you received a live vaccine in the last 4 weeks?  NO    For women:  Are you pregnant or is there a chance you could become pregnant during the next month?  NO    Are you or any members of your household immune suppressed (steroid treatment, AIDS, cancer)?   NO    During the past year, have you received a transfusion of blood or blood products, or been given immune gamma globulin or an antiviral drug? NO        If YES to any of the questions above - Do NOT give vaccine.  Consult with RN or provider in clinic.  (#5 can be YES if all Live vaccine questions are answered NO)    If NO to all questions above - Patient may receive vaccine.    6.  Do you need to receive the Flu vaccine today? NO    Is the patient <41 years old? NO {if yes, parent/guardian must be present to give consent)    Vaccine information sheet(s) discussed, patient/parent/guardian verbalized understanding? YES     All patients are encouraged to wait 15 minutes before leaving after receiving any vaccine.    VIS given 01/11/2020 by Golden Circle, CMA .      Vaccine given today without initial adverse effect. YES    Golden Circle, CMA

## 2020-01-11 NOTE — Progress Notes (Signed)
Chief Complaint   Patient presents with    Chest Pain     chest pain off and on     Breast Problem     R breast        Subjective:   Meagan Coffey is a 29 year old female who presents on 01/11/2020 for the following concerns:     She would like to discuss a right breast mass that she is palpated in the last 4 months.  Nontender.  Unsure if it has grown much.  Does not notice correlation with her menstrual cycles.  She does get breast tenderness around midcycle.  She has a history of breast cysts.    There is no family history of breast cancer.    She is not on any hormonal medication at present.     She has been working with [facility].  Recently had her hysteroscopy HSG studies.  She was told she does not have immunity to varicella and should get the varicella vaccine series.  She thinks she had one of the chickenpox vaccines in childhood.  She has never had chickenpox the disease.    Current Outpatient Medications   Medication Sig Dispense Refill    levothyroxine 50 MCG tablet take 1 tablet by mouth once daily  ON EMPTY STOMACH 30 tablet 8    Prenatal Vit-Fe Fumarate-FA (PRENATAL FORMULA) 27-1 MG Oral Tablet Take 1 tablet by mouth daily. 100 tablet 3    Vyvanse 40 MG capsule Take 40 mg by mouth every morning.       No current facility-administered medications for this visit.         Objective:   Vitals: BP 97/68    Pulse 75    Temp 36.6 C (Temporal)    Resp 12    Wt 61.4 kg (135 lb 6.4 oz)    LMP 12/20/2019    SpO2 100%    BMI 26.44 kg/m   Physical Exam  General: Appears a little anxious.  BREASTS:  Inspection:  Normal breast symmetry.  Normal nipples, without discharge.  No skin lesions., Palpation:  Significant findings include   Right breast: Approximately 4 to 5 cm firm nontender mobile mass at 12:00 that is 6 cm from the areola.  Left breast: No palpable masses  Axillary nodes:  No adenopathy.        Assessment and Plan:   1. Breast lump on right side at 12 o'clock position  -  Mammography Diagnostic with Tomo Right; Future    2. Need for varicella vaccine  - varicella virus vaccine live (Varivax) injection 0.5 mL     Start with breast imaging with diagnostic mammogram and ultrasound if needed.  Plan to schedule a wellness visit with me 2 to 3 days after the mammogram.    She is due for a Pap smear and we can do this at her wellness exam.  Also send me lab results from Reproductive medicine that shows she does not have varicella immunity    York Ram, MD  [facility]  [address], [address]  [place] [address]  [phone]

## 2020-01-12 ENCOUNTER — Other Ambulatory Visit (INDEPENDENT_AMBULATORY_CARE_PROVIDER_SITE_OTHER): Payer: Self-pay | Admitting: Family Medicine

## 2020-01-12 ENCOUNTER — Telehealth (INDEPENDENT_AMBULATORY_CARE_PROVIDER_SITE_OTHER): Payer: Self-pay | Admitting: Family Medicine

## 2020-01-12 DIAGNOSIS — N6315 Unspecified lump in the right breast, overlapping quadrants: Secondary | ICD-10-CM

## 2020-01-12 NOTE — Telephone Encounter (Signed)
1st attempt: LVM for pt to proceed with [place]. Once York Ram, MD gets the results, she will advise the patient on the next steps.

## 2020-01-12 NOTE — Telephone Encounter (Signed)
Let her know to proceed with the breast ultrasound and based on those results we can decide the next step.    York Ram, MD

## 2020-01-12 NOTE — Telephone Encounter (Signed)
Pt called and stated that she attempted to schedule an apt with [facility] for a mammogram and a biopsy. Pt was told that she could only schedule a [place] because of her age.     Pt wanted to inform to Prakasam, Maxine Glenn, MD that she could not schedule the mammogram and biopsy. She wanted to know if she needs to go elsewhere to get these exams done?    Routing to York Ram, MD: Please advise.

## 2020-01-15 ENCOUNTER — Ambulatory Visit
Admission: RE | Admit: 2020-01-15 | Discharge: 2020-01-15 | Disposition: A | Discharge status: Home / Self Care | Payer: No Typology Code available for payment source | Attending: Diagnostic Radiology | Admitting: Diagnostic Radiology

## 2020-01-15 DIAGNOSIS — N6315 Unspecified lump in the right breast, overlapping quadrants: Secondary | ICD-10-CM

## 2020-01-15 NOTE — Telephone Encounter (Signed)
Called pt and relayed messsage, closing TE.

## 2020-02-03 ENCOUNTER — Other Ambulatory Visit (INDEPENDENT_AMBULATORY_CARE_PROVIDER_SITE_OTHER): Payer: Self-pay | Admitting: Family Medicine

## 2020-02-03 DIAGNOSIS — E038 Other specified hypothyroidism: Secondary | ICD-10-CM

## 2020-02-03 DIAGNOSIS — E063 Autoimmune thyroiditis: Secondary | ICD-10-CM

## 2020-02-04 ENCOUNTER — Other Ambulatory Visit (INDEPENDENT_AMBULATORY_CARE_PROVIDER_SITE_OTHER): Payer: Self-pay | Admitting: Family Medicine

## 2020-02-04 DIAGNOSIS — E038 Other specified hypothyroidism: Secondary | ICD-10-CM

## 2020-02-04 DIAGNOSIS — E063 Autoimmune thyroiditis: Secondary | ICD-10-CM

## 2020-02-06 MED ORDER — LEVOTHYROXINE SODIUM 50 MCG OR TABS
50.0000 ug | ORAL_TABLET | Freq: Every day | ORAL | 1 refills | Status: DC
Start: 2020-02-06 — End: 2020-07-23

## 2020-02-29 ENCOUNTER — Ambulatory Visit (INDEPENDENT_AMBULATORY_CARE_PROVIDER_SITE_OTHER): Payer: No Typology Code available for payment source | Admitting: Family Medicine

## 2020-03-15 ENCOUNTER — Ambulatory Visit (INDEPENDENT_AMBULATORY_CARE_PROVIDER_SITE_OTHER): Payer: Self-pay | Admitting: Family Medicine

## 2020-04-11 ENCOUNTER — Ambulatory Visit (INDEPENDENT_AMBULATORY_CARE_PROVIDER_SITE_OTHER): Payer: No Typology Code available for payment source | Admitting: Family Medicine

## 2020-06-11 ENCOUNTER — Other Ambulatory Visit (HOSPITAL_BASED_OUTPATIENT_CLINIC_OR_DEPARTMENT_OTHER): Payer: Self-pay | Admitting: Obstetrics & Gynecology

## 2020-06-11 DIAGNOSIS — N979 Female infertility, unspecified: Secondary | ICD-10-CM

## 2020-07-21 ENCOUNTER — Other Ambulatory Visit (INDEPENDENT_AMBULATORY_CARE_PROVIDER_SITE_OTHER): Payer: Self-pay | Admitting: Family Medicine

## 2020-07-21 DIAGNOSIS — E038 Other specified hypothyroidism: Secondary | ICD-10-CM

## 2020-07-23 MED ORDER — LEVOTHYROXINE SODIUM 50 MCG OR TABS
ORAL_TABLET | ORAL | 0 refills | Status: DC
Start: 2020-07-23 — End: 2020-08-07

## 2020-07-23 NOTE — Telephone Encounter (Signed)
Authorized one refill, get updated prescription at upcoming appointment

## 2020-08-06 ENCOUNTER — Telehealth (INDEPENDENT_AMBULATORY_CARE_PROVIDER_SITE_OTHER): Payer: Self-pay | Admitting: Family Medicine

## 2020-08-06 ENCOUNTER — Ambulatory Visit (INDEPENDENT_AMBULATORY_CARE_PROVIDER_SITE_OTHER): Payer: 59 | Admitting: Family Medicine

## 2020-08-06 ENCOUNTER — Encounter (INDEPENDENT_AMBULATORY_CARE_PROVIDER_SITE_OTHER): Payer: Self-pay | Admitting: Family Medicine

## 2020-08-06 VITALS — BP 105/69 | HR 77 | Temp 98.0°F | Resp 14 | Ht 60.83 in | Wt 134.1 lb

## 2020-08-06 DIAGNOSIS — E038 Other specified hypothyroidism: Secondary | ICD-10-CM

## 2020-08-06 DIAGNOSIS — Z0184 Encounter for antibody response examination: Secondary | ICD-10-CM

## 2020-08-06 DIAGNOSIS — Z0000 Encounter for general adult medical examination without abnormal findings: Secondary | ICD-10-CM

## 2020-08-06 DIAGNOSIS — E063 Autoimmune thyroiditis: Secondary | ICD-10-CM

## 2020-08-06 NOTE — Patient Instructions (Signed)
Recommendations for Pap Smear screening age 32-65    . Pap smear every 3 years OR pap smear with HPV screening every 5 years  o Exceptions:   - if you have ever been treated for moderate or severe dysplasia of the cervix and have completed post-treatment surveillance, you can return to routine screening for your age group, and continue Pap screening for at least twenty years.  - If you have a suppressed immune system you may need more frequent screening.  . Discontinue screening at age 65 if there have been three consecutive normal Pap smears or negative HPV test.  . If you are sexually active, you should have a yearly screening for Chlamydia, HIV, and other sexually transmitted infections as appropriate  . If you are using contraception, you should see your provider yearly for symptom review, blood pressure check (if using a hormonal method), and exam if needed.  Leading a Healthy Life  Six tips to help improve your health and wellness     This explains how these 6 basic guidelines may improve your health and wellness:   Eat well to give your body the energy it needs.   Stay or get active.   A healthy mind is part of a healthy body.   Practice safe living habits.   Keep your mind and body free of harmful drugs and alcohol.   Get regular health care.     Tip #1: Eat well to give your body the energy it needs.   Your body needs nutritious foods to stay strong and healthy.   Here are some general eating guidelines:   Have 2 servings of fish or other seafood 2 times a week (1 serving = 4 ounces).   If you eat dairy products, choose low-fat (1%) or nonfat ones.   If you eat meat, cut down on the amount. Replace it with plant-based foods such as beans, whole grains, fruits and vegetables, and nuts and seeds.   Have less than 1,500 mg of sodium (salt) a day.   Cut down on "junk food" like alcohol, fatty foods, chips, candy, and other sweets.     Tip #2: Stay or get active.   Exercise for at least 30 minutes at a time, 3  times a week. Regular physical activity can help you:   Live longer and feel better   Be stronger and more flexible   Build strong bones   Prevent depression   Strengthen your immune system   Maintain a healthy body weight     Tip #3: Remember: A healthy mind is part of a healthy body.   A good state of mind can help you make healthy choices. Here are a few tips for keeping your mind healthy:   Reduce stress in your life.   Make some time every day for things that are fun.   Get enough sleep. Lack of sleep reduces how well you can concentrate, increases mood swings, and raises your risk of having a car accident.   Ask your health care provider for help if you feel depressed or anxious for more than several days in a row.     Tip #4: Practice safe living habits.   Accidents and Injuries     Accidents and injuries are the 5th leading cause of death in the [place]   Accidents in the home cause thousands of permanent injuries every year.     The most common accidents are fires, falls, and drowning. To help   yourself and your family stay safe:   Install smoke detectors on each floor of your home.   Make sure everyone in your family knows how to swim  Stay safe on the road:  Wear a seatbelt.   Do not ride with someone who has been drinking or taking drugs.   Do not speak on a cell phone or send, read, or write text messages while you are driving.   Wear a helmet when you ride a bicycle or motorcycle.   Get enough sleep at night, and do not drive when you are tired.     Hand Hygiene   Protect yourself from germs by washing your hands often. Always wash your hands:   After you change a diaper or use the toilet   Before you start and after you finish preparing food     Tip #5: Keep your mind and body free of harmful drugs and alcohol.   Tobacco causes more health problems than any other substance. These problems include lung disease, heart disease, and many types of cancer. The nicotine in tobacco is the most addictive and  widely used drug.   Too much alcohol can cause damage to your liver, heart, brain, bones, and other body tissues. Being under the influence of alcohol also increases your chance of being injured in an accident.    Alcohol can cause fetal alcohol syndrome in your children if you drink regularly when you are pregnant.   Street drugs, like marijuana, cocaine, methamphetamine, heroin, or pain pills not prescribed by your doctor can harm your health. They may be mixed with harmful substances, and using them can cause people to put themselves in dangerous situations.     Tip #6: Get regular health care.   Many people think they need to see the doctor only when they are sick. But, health care providers can also help you stay healthy.   Find a health care provider who works with you to manage your health.   Ask your health care provider what diseases you are at risk for. Learn what you can do to prevent or control them.   Get yourself and your family immunized against life-threatening diseases.

## 2020-08-06 NOTE — Telephone Encounter (Signed)
Routing to lizette

## 2020-08-06 NOTE — Progress Notes (Signed)
Meagan Coffey  Chief Complaint   Patient presents with   . Wellness     Wellness exam and blood work       Meagan Coffey is a 30 year old female here today for a preventive health visit.   Other problems or concerns today:     Working with [employer]. Had a hysteroscopic procedure  in 12/2019. Recovered well. Spouse has sperm motility issues. Doing timed intercourse. Hoping for natural conception.    Reviewed breast US from 01/2020. She still feels the breast fullness and pain with her cycles.    She is taking care of her grandma who is on hospice at an Assisted Living Facility in [place]. Source of stress, travels often. Sees her a psych ARNP for her mental health :anxiety and ADHD> she is now off vyvanse and on straterra and xanax.     GYN HISTORY  OB History   Gravida Para Term Preterm AB Living   2 0 0 0 2 0   SAB IAB Ectopic Multiple Live Births   1 1 0 0 0       Currently having periods: YES  Patient's last menstrual period was 07/23/2020.   Periods are regular q 28-30 days, lasting 4-5 days.    Crampy, lower abdominal pain during periods: mild, occurring first 1-2 days of flow  Cyclic symptoms: none  Intermenstrual bleeding, spotting,or discharge: No   Last pap: [facility] at [facility] last year  Pap history: no history of abnormal paps  Other gyn history: as above    SEXUAL HISTORY  Sexual activity: yes, single partner, contraception - none  Age at first intercourse: Not asked today  History of STDs: none known  Number of sex partners in the past year: 1  Last STD check: last year  New partner(s) since last STD check: No    Sexual concerns: No    }    CANCER SCREENING  Family History     Problem (# of Occurrences) Relation (Name,Age of Onset)    Cancer (1) Mother    Depression (1) Sister    Lung Cancer (1) Mother (21)    Lupus (1) Maternal Aunt       Negative family history of: Colorectal Cancer            Prior mammogram: NO  History of abnormal mammogram: N/A    LIFESTYLE  Current dietary  habits: healthy diet in general  Gluten free  High protein diet  Could eat more leafy greens  Calcium: dietary sources only  Current exercise habits: stays active: uses stairs at work and gets steps when she can  Regular seat belt use: YES  Substance use:  reports that she quit smoking about 6 years ago. She smoked 0.00 packs per day. She has never used smokeless tobacco. She reports current alcohol use. She reports current drug use. Drug: Marijuana.  Exposure to hazardous materials: No  Guns in the house: Not asked today    Review Of Systems  + anxiety      EXAM:  BP 105/69   Pulse 77   Temp 36.7 C (Temporal)   Resp 14   Ht 5' 0.83" (1.545 m)   Wt 60.8 kg (134 lb 1.6 oz)   LMP 07/23/2020   SpO2 100%   BMI 25.48 kg/m   Body mass index is 25.48 kg/m.  General: healthy, alert, no distress  Head: Normocephalic. No masses, lesions, tenderness or abnormalities  supple. No adenopathy.  Thyroid symmetric, normal size, without nodules  Lungs: LUNGS: The lungs are clear to auscultation.  Heart: normal rate, regular rhythm  Skin: Skin color, texture, turgor normal. No rashes or concerning lesions, numerous freckles on exposed areas.  Abdomen: soft, non-tender. BS normal. No masses or organomegaly  Neuro:  Grossly normal to observation, gait normal  Breasts: No obvious deformity or mass to inspection, nipples everted bilaterally, no skin lesion or nipple discharge, no mass palpated, no axillary lymphadenopathy  Pelvic exam: deferred    ASSESSMENT/PLAN:  1. Encounter for general adult medical examination without abnormal findings    - Comprehensive Metabolic Panel; Future  - Lipid Panel; Future  - Lipid Panel  - Comprehensive Metabolic Panel    2. Hypothyroidism due to Hashimoto's thyroiditis  Check labs and refill meds after I see results  - TSH with Reflexive Free T4; Future  - TSH with Reflexive Free T4    3. Immunity status testing  wants to be sure she is immune.  - Varicella zoster Immune Status; Future  -  Varicella zoster Immune Status    Health Maintenance   Topic Date Due   . HIV Screening  Never done   . Cervical Cancer Screening  11/21/2019   . Depression Screening (PHQ-2)  01/10/2021   . DTaP, Tdap, and Td Vaccines (3 - Td or Tdap) 12/08/2027   . Influenza Vaccine  Completed   . Hepatitis C Screening  Completed   . COVID-19 Vaccine  Completed   . Hepatitis A Vaccine  Aged Out   . Hepatitis B Vaccine  Aged Out   . Pneumococcal Vaccine: Pediatrics (0-5 years) and At-Risk Patients (6-64 years)  Aged Out       Immunizations or studies due: none - up to date  Chlamydia screening offered: N/A  HIV screening offered: N/A  HPV vaccine offered: N/A    Preventive counseling: health maintenance  immunizations  adequate calcium intake  vitamin D supplementation  Follow-up: 1 year

## 2020-08-06 NOTE — Telephone Encounter (Signed)
Signed release of information faxed to obtain outside records for patient     Facility: [facility]   Fax Number: [phone]  Phone Number: [phone]  Fax Confirmation Received:    Patient's signed release of information given to PSR  Please follow up regarding medical records    ROI placed at the front desk.

## 2020-08-07 LAB — COMPREHENSIVE METABOLIC PANEL
ALT (GPT): 10 U/L (ref 7–33)
AST (GOT): 15 U/L (ref 9–38)
Albumin: 4.3 g/dL (ref 3.5–5.2)
Alkaline Phosphatase (Total): 61 U/L (ref 25–100)
Anion Gap: 7 (ref 4–12)
Bilirubin (Total): 0.3 mg/dL (ref 0.2–1.3)
Calcium: 9.2 mg/dL (ref 8.9–10.2)
Carbon Dioxide, Total: 28 meq/L (ref 22–32)
Chloride: 103 meq/L (ref 98–108)
Creatinine: 0.55 mg/dL (ref 0.38–1.02)
Glucose: 81 mg/dL (ref 62–125)
Potassium: 4.2 meq/L (ref 3.6–5.2)
Protein (Total): 6.8 g/dL (ref 6.0–8.2)
Sodium: 138 meq/L (ref 135–145)
Urea Nitrogen: 11 mg/dL (ref 8–21)
eGFR by CKD-EPI: >60 mL/min/{1.73_m2} (ref >59)

## 2020-08-07 LAB — LIPID PANEL
Cholesterol (LDL): 96 mg/dL (ref <130)
Cholesterol/HDL Ratio: 2.4
HDL Cholesterol: 73 mg/dL (ref >39)
Non-HDL Cholesterol: 105 mg/dL (ref 0–159)
Total Cholesterol: 178 mg/dL (ref <200)
Triglyceride: 46 mg/dL (ref <150)

## 2020-08-07 LAB — VZV IMMUNE STATUS BY IFA: Varicella Zoster Immune Status Result: POSITIVE

## 2020-08-07 LAB — TSH WITH REFLEXIVE FREE T4: TSH with Reflexive Free T4: 3.601 u[IU]/mL (ref 0.400–5.000)

## 2020-08-07 MED ORDER — LEVOTHYROXINE SODIUM 50 MCG OR TABS
ORAL_TABLET | ORAL | 0 refills | Status: DC
Start: 2020-08-07 — End: 2020-11-11

## 2020-08-07 MED ORDER — LEVOTHYROXINE SODIUM 75 MCG OR TABS
ORAL_TABLET | ORAL | 0 refills | Status: DC
Start: 2020-08-07 — End: 2020-11-11

## 2020-08-07 NOTE — Addendum Note (Signed)
Addended by: York Ram on: 08/07/2020 08:13 AM     Modules accepted: Orders

## 2020-08-07 NOTE — Addendum Note (Signed)
Addended by: York Ram on: 08/07/2020 08:14 AM     Modules accepted: Orders

## 2020-08-08 NOTE — Telephone Encounter (Signed)
Outside medical records imported. Routing to PCP. FYI

## 2020-08-08 NOTE — Telephone Encounter (Signed)
1st attempt faxed. Follow up in 2 weeks.

## 2020-08-08 NOTE — Telephone Encounter (Signed)
Called Pt and informed what provider stated. Pt says she will contact clinic and then schedule with Dr.Prakasam after. Closing TE.

## 2020-08-08 NOTE — Telephone Encounter (Signed)
Please call and let the patient know that we got records from [facility] fertility clinic.  This did not show a Pap smear.  She can call them directly and see if she had one done in 2020 the report.  If she has not had 1 done then okay to schedule with me sometime next week for Pap only visit.  Okay to double book if needed.  York Ram, MD

## 2020-11-10 ENCOUNTER — Other Ambulatory Visit (INDEPENDENT_AMBULATORY_CARE_PROVIDER_SITE_OTHER): Payer: Self-pay | Admitting: Family Medicine

## 2020-11-10 DIAGNOSIS — E063 Autoimmune thyroiditis: Secondary | ICD-10-CM

## 2020-11-10 DIAGNOSIS — E038 Other specified hypothyroidism: Secondary | ICD-10-CM

## 2020-11-11 ENCOUNTER — Encounter (INDEPENDENT_AMBULATORY_CARE_PROVIDER_SITE_OTHER): Payer: Self-pay

## 2020-11-11 MED ORDER — LEVOTHYROXINE SODIUM 75 MCG OR TABS
ORAL_TABLET | ORAL | 0 refills | Status: DC
Start: 2020-11-11 — End: 2020-12-04

## 2020-11-11 MED ORDER — LEVOTHYROXINE SODIUM 50 MCG OR TABS
ORAL_TABLET | ORAL | 0 refills | Status: DC
Start: 2020-11-11 — End: 2020-12-10

## 2020-11-15 ENCOUNTER — Encounter (INDEPENDENT_AMBULATORY_CARE_PROVIDER_SITE_OTHER): Payer: 59 | Admitting: Family Medicine

## 2020-11-15 ENCOUNTER — Other Ambulatory Visit (INDEPENDENT_AMBULATORY_CARE_PROVIDER_SITE_OTHER): Payer: 59

## 2020-11-15 DIAGNOSIS — E063 Autoimmune thyroiditis: Secondary | ICD-10-CM

## 2020-11-15 DIAGNOSIS — E038 Other specified hypothyroidism: Secondary | ICD-10-CM

## 2020-11-16 LAB — TSH WITH REFLEXIVE FREE T4: TSH with Reflexive Free T4: 2.773 u[IU]/mL (ref 0.400–5.000)

## 2020-12-03 ENCOUNTER — Other Ambulatory Visit (INDEPENDENT_AMBULATORY_CARE_PROVIDER_SITE_OTHER): Payer: Self-pay | Admitting: Family Medicine

## 2020-12-03 DIAGNOSIS — E038 Other specified hypothyroidism: Secondary | ICD-10-CM

## 2020-12-04 MED ORDER — LEVOTHYROXINE SODIUM 75 MCG OR TABS
75.0000 ug | ORAL_TABLET | ORAL | 0 refills | Status: DC
Start: 2020-12-04 — End: 2020-12-10

## 2020-12-10 ENCOUNTER — Encounter (INDEPENDENT_AMBULATORY_CARE_PROVIDER_SITE_OTHER): Payer: Self-pay | Admitting: Family Medicine

## 2020-12-10 ENCOUNTER — Ambulatory Visit (INDEPENDENT_AMBULATORY_CARE_PROVIDER_SITE_OTHER): Payer: 59 | Admitting: Family Medicine

## 2020-12-10 VITALS — BP 116/76 | HR 77 | Temp 98.7°F | Resp 14 | Wt 135.6 lb

## 2020-12-10 DIAGNOSIS — F419 Anxiety disorder, unspecified: Secondary | ICD-10-CM

## 2020-12-10 DIAGNOSIS — E063 Autoimmune thyroiditis: Secondary | ICD-10-CM

## 2020-12-10 DIAGNOSIS — F900 Attention-deficit hyperactivity disorder, predominantly inattentive type: Secondary | ICD-10-CM

## 2020-12-10 DIAGNOSIS — L503 Dermatographic urticaria: Secondary | ICD-10-CM

## 2020-12-10 DIAGNOSIS — R5382 Chronic fatigue, unspecified: Secondary | ICD-10-CM

## 2020-12-10 DIAGNOSIS — E038 Other specified hypothyroidism: Secondary | ICD-10-CM

## 2020-12-10 DIAGNOSIS — Z23 Encounter for immunization: Secondary | ICD-10-CM

## 2020-12-10 MED ORDER — INFLUENZA VAC SUBUNIT QUAD 0.5 ML IM SUSY
0.5000 mL | PREFILLED_SYRINGE | Freq: Once | INTRAMUSCULAR | Status: AC
Start: 2020-12-10 — End: 2020-12-10
  Administered 2020-12-10: 0.5 mL via INTRAMUSCULAR

## 2020-12-10 MED ORDER — LEVOTHYROXINE SODIUM 75 MCG OR TABS
75.0000 ug | ORAL_TABLET | Freq: Every day | ORAL | 0 refills | Status: DC
Start: 2020-12-10 — End: 2021-06-30

## 2020-12-10 NOTE — Progress Notes (Signed)
Chief Complaint   Patient presents with    Follow-Up      TSH levels    Hives    Fatigue    Flu Shot       Subjective:   Meagan Coffey is a 30 year old female who presents on 12/10/2020 for the following concerns:    Time visit started 3:50 PM    Last saw me in 07/2020. She is inbetween jobs right now and scheduled this visit to followup on a few issues.     Anxiety: lots of recent stressors: her older sister who struggled with substance use d/o died of an overdose earlier this year. She lost another older family member as well. This along with her fertility struggles has contributed to her mood and anxiety.. Currently seeing a psych provider Gene Harle Stanford. She is on vyvanse alone right now. She has tried Careers information officer but  had nausea. Has tried lexapor, prozac and duloxetine. They do make her hypomanic and ineffective. She is looking to start therapy first and then reconsider meds. Wary of starting new meds as she is planning conception.     Thyroid dose: she has been doing sat/sun and Mon-Friday for about 4 weeks. TSH looks better. Willing to try a higher dose to get her TSH below 2.5 but worried about this contributing to more anxiety.  Results for orders placed or performed in visit on 11/15/20   TSH with Reflexive Free T4   Result Value Ref Range    TSH with Reflexive Free T4 2.773 0.400 - 5.000 u[IU]/mL        Fatigue: "no matter how much I sleep I am always tired". She is getting 8-10hrs at night. Wondering if we should check more labs.     Hives: chronic issue since age 14. Identified triggers: pressure, stress and heat exposure. Temporary. She uses antihistamines.      Current Outpatient Medications   Medication Sig Dispense Refill    levothyroxine 75 MCG tablet Take 1 tablet (75 mcg) by mouth daily on an empty stomach. 90 tablet 0    Prenatal Vit-Fe Fumarate-FA (PRENATAL FORMULA) 27-1 MG Oral Tablet Take 1 tablet by mouth daily. 100 tablet 3    Vyvanse 30 MG capsule Take 30 mg by mouth every  morning.       No current facility-administered medications for this visit.         Objective:   Vitals: BP 116/76    Pulse 77    Temp 37.1 C (Temporal)    Resp 14    Wt 61.5 kg (135 lb 9.6 oz)    LMP 12/06/2020    SpO2 100%    BMI 25.77 kg/m   Physical Exam  Gen: slightly anxious.   Neck: thyroid not enlarged  Skin: +dermatographism. No hives on exam    Assessment and Plan:   (R53.82) Chronic fatigue  (primary encounter diagnosis)  Plan: Vitamin B12 (Cobalamin), Vitamin D 25-OH COMMON        Deficiency Level, Ferritin, CBC        Plan for wider panel of labs which she will schedule in 6-8 weeks    (E03.8,  E06.3) Hypothyroidism due to Hashimoto's thyroiditis  Plan: levothyroxine 75 MCG tablet, TSH with Reflexive        Free T4        Increased to daily for ease of dosing. 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 Recent stressors and agree with her starting therapy. Discuss with psych provider as well.    (L50.3) Dermatography  Plan: discussed. May consider allergy referral in the future. For now avoid known triggers as much as possible.           York Ram, MD  [facility]  [address], [address]  [place] [address]  [phone]

## 2020-12-10 NOTE — Progress Notes (Signed)
Vaccine Screening Questions    Interpreter: No    1. Are you allergic to Latex? NO    2.  Have you had a serious reaction or an allergic reaction to a vaccine?  NO    3.  Currently have a moderate or severe illness, including fever?  NO    4.  Ever had a seizure or any neurological problem associated with a vaccine? (DTaP/TDaP/DTP pertinent) NO    5.  Is patient receiving any live vaccinations today? (Varicella-Chickenpox, MMR-Measles/Mumps/Rubella, Zoster-Shingles, Flumist, Yellow Fever) NOTE: oral rotavirus is exempt  NO    If YES to any of the questions above - Do NOT give vaccine.  Consult with RN or provider in clinic.  (#5 can be YES if all Live vaccine questions are answered NO)    If NO to all questions above - Patient may receive vaccine.    6. Do you need to receive the Flu vaccine today? YES - Additional Flu Questions  Flu Vaccine Screening Questions:    Ever had a serious allergic reaction to eggs?  NO    Ever had Guillain-Barre syndrome associated with a vaccine? NO    Less than 6 months old? NO    If YES to any of the Flu questions above - NO Flu Vaccine to be given.  Patient may consult provider as needed.    If NO to all questions above - Patient may receive Flu Shot (IM)    Is the patient requesting Flumist? NO    If between 6 months and 64 years of age, was flu vaccine received last year?  NO  If NO to above question:   Children who are receiving influenza vaccine for the first time - administer 2 doses of the current influenza vaccine (separated by at least 4 weeks).          All patients are encouraged to wait 15 minutes before leaving after receiving any vaccine.    VIS given 12/10/2020 by Golden Circle, CMA.      Vaccine given today without initial adverse effect. YES    Golden Circle, CMA

## 2020-12-13 ENCOUNTER — Ambulatory Visit (HOSPITAL_BASED_OUTPATIENT_CLINIC_OR_DEPARTMENT_OTHER): Payer: 59 | Admitting: Obstetrics & Gynecology

## 2021-01-11 ENCOUNTER — Other Ambulatory Visit (INDEPENDENT_AMBULATORY_CARE_PROVIDER_SITE_OTHER): Payer: 59

## 2021-01-11 DIAGNOSIS — R5382 Chronic fatigue, unspecified: Secondary | ICD-10-CM

## 2021-01-11 DIAGNOSIS — E038 Other specified hypothyroidism: Secondary | ICD-10-CM

## 2021-01-11 DIAGNOSIS — E063 Autoimmune thyroiditis: Secondary | ICD-10-CM

## 2021-01-11 LAB — CBC (HEMOGRAM)
Hematocrit: 41 % (ref 36.0–45.0)
Hemoglobin: 13.5 g/dL (ref 11.5–15.5)
MCH: 27.9 pg (ref 27.3–33.6)
MCHC: 32.7 g/dL (ref 32.2–36.5)
MCV: 85 fL (ref 81–98)
Platelet Count: 345 10*3/uL (ref 150–400)
RBC: 4.84 10*6/uL (ref 3.80–5.00)
RDW-CV: 14.3 % (ref 11.0–14.5)
WBC: 6.04 10*3/uL (ref 4.3–10.0)

## 2021-01-11 LAB — TSH WITH REFLEXIVE FREE T4: TSH with Reflexive Free T4: 1.452 u[IU]/mL (ref 0.400–5.000)

## 2021-01-11 LAB — VITAMIN B12 (COBALAMIN): Vitamin B12 (Cobalamin): 283 pg/mL (ref 180–914)

## 2021-01-11 LAB — FERRITIN: Ferritin: 15 ng/mL (ref 10–180)

## 2021-01-14 LAB — VITAMIN D (25 HYDROXY)
Vit D (25_Hydroxy) Total: 24.2 ng/mL (ref 20.1–50.0)
Vitamin D2 (25_Hydroxy): <1 ng/mL
Vitamin D3 (25_Hydroxy): 24.2 ng/mL

## 2021-06-26 ENCOUNTER — Telehealth (INDEPENDENT_AMBULATORY_CARE_PROVIDER_SITE_OTHER): Payer: Self-pay | Admitting: Family Medicine

## 2021-06-26 DIAGNOSIS — R102 Pelvic and perineal pain: Secondary | ICD-10-CM

## 2021-06-26 DIAGNOSIS — N941 Unspecified dyspareunia: Secondary | ICD-10-CM

## 2021-06-26 NOTE — Telephone Encounter (Signed)
Patient requesting referral for gynecological issues and  cervical cancer screening.    To provider-would you like to see the patient in clinic Or approve pended referral.    *When responding, please route back to [facility] REFERRAL POOL*

## 2021-06-26 NOTE — Telephone Encounter (Signed)
OB/GYN referral has been processed to [facility]. Sent a Mychart message to patient with this information.

## 2021-06-26 NOTE — Telephone Encounter (Signed)
RETURN CALL: Voicemail - Detailed Message      SUBJECT:  Referral Request/Questions      REASON FOR REFERRAL: Gynecological issues and  cervical cancer screening   NAME OF CLINIC/SPECIALTY: [facility]   PROVIDER: N/A   PHONE: N/A  FAX: N/A  ADDITIONAL INFORMATION: N/A

## 2021-06-26 NOTE — Telephone Encounter (Signed)
Please see replied from patient

## 2021-06-26 NOTE — Telephone Encounter (Signed)
LVM for pt to relay provider's message below. CCR please help relay message when pt calls back. Thank you.  Sent mychart message

## 2021-06-26 NOTE — Telephone Encounter (Signed)
Please clarify reason for referral.  Also clarify if she wants to be seen here at [facility] or [facility] clinic.  Pap smear screening can be done by PCP as well    Maud Deed, MD

## 2021-06-28 ENCOUNTER — Other Ambulatory Visit (INDEPENDENT_AMBULATORY_CARE_PROVIDER_SITE_OTHER): Payer: Self-pay | Admitting: Family Medicine

## 2021-06-28 DIAGNOSIS — E038 Other specified hypothyroidism: Secondary | ICD-10-CM

## 2021-06-30 MED ORDER — LEVOTHYROXINE SODIUM 75 MCG OR TABS
75.0000 ug | ORAL_TABLET | Freq: Every day | ORAL | 1 refills | Status: DC
Start: 2021-06-30 — End: 2021-12-30

## 2021-07-16 ENCOUNTER — Telehealth (INDEPENDENT_AMBULATORY_CARE_PROVIDER_SITE_OTHER): Payer: BLUE CROSS/BLUE SHIELD | Admitting: Family Medicine

## 2021-08-01 ENCOUNTER — Ambulatory Visit (HOSPITAL_BASED_OUTPATIENT_CLINIC_OR_DEPARTMENT_OTHER): Payer: BLUE CROSS/BLUE SHIELD | Admitting: Obstetrics & Gynecology

## 2021-08-14 ENCOUNTER — Ambulatory Visit (INDEPENDENT_AMBULATORY_CARE_PROVIDER_SITE_OTHER): Payer: BLUE CROSS/BLUE SHIELD | Admitting: Obstetrics & Gynecology

## 2021-08-14 ENCOUNTER — Other Ambulatory Visit: Payer: BLUE CROSS/BLUE SHIELD | Attending: Obstetrics & Gynecology | Admitting: Obstetrics & Gynecology

## 2021-08-14 VITALS — BP 113/77 | HR 97 | Temp 98.7°F | Resp 16 | Wt 135.0 lb

## 2021-08-14 DIAGNOSIS — R102 Pelvic and perineal pain: Secondary | ICD-10-CM | POA: Insufficient documentation

## 2021-08-14 DIAGNOSIS — Z1151 Encounter for screening for human papillomavirus (HPV): Secondary | ICD-10-CM | POA: Insufficient documentation

## 2021-08-14 NOTE — Progress Notes (Signed)
[name], Gyn visit: Pelvic pain       Chief Complaint   Patient presents with    Pelvic Pain     Pain during ovulation and period          Subjective:     Meagan Coffey is a 31 year old female who presents on 08/14/2021.  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 Partner has Crohn's disease and was on Sulfasalazine and Azathioprine previously but now changed to Stelara. Had semen analysis with normal counts but aberrant morphology and low motility.   She was planned to possibly then come back for IUI but was planning to try to get pregnant on her own.    The pelvic pain started Jan 2023, right lower quadrant, pulsating. Pt had occasionally similar pains during ovulation alternating sides, started after surgery 12/2019. It seemed that after January 2023, the pain is getting worse both during ovulation and menstruation and the pain is pulsating, cramping, intense menstrual cramp that is located not in the midline but over on the right side. Localized, but feels sometimes from front to back. When the intensity of the pain was a 8/10 she felt that it was time to do something about it. The pain intensity most during February and March.     Pt has chronic hives and is prone to inflammation.     Will order ultrasound for pelvic pain. Pt had no ultrasound done after removal of uterine septum.       Objective:    Vitals: BP 113/77   Pulse 97   Temp 37.1 C (Temporal)   Resp 16   Wt 61.2 kg (135 lb)   LMP 07/21/2021   SpO2 99%   BMI 25.65 kg/m   Physical Exam  Gen: alert, oriented x 3  Abdomen: soft, mild pain on deep palpation in RLQ, no rebound or guarding  No CMT  No uterine fundal tenderness, no adnexal masses or tenderness     Assessment and Plan:      Alianys was seen today for pelvic  pain.    Pelvic pain in female  -     GC&CHLAM NUCLEIC ACID DETECTN; Future  -     US Pelvis Complete  W Transvag; Future  -     Cervical Cancer Screening  Pain started after hysteroscopic septum resection 12/2019. She has no cervical motion tenderness and no leucorrhea. There is some pain on palpation in the right lower quadrant. Will get ultrasound for reason of pain.  I spent a total of >30 minutes for the patient's care on the date of the service.       [name], MD

## 2021-08-15 LAB — HPV REFLEX TO PAP
HPV 16 Genotype: NEGATIVE
HPV 18 Genotype: NEGATIVE
High Risk HPV Screening: NEGATIVE
Other High Risk HPV Genotype: NEGATIVE

## 2021-08-15 LAB — GC&CHLAM NUCLEIC ACID DETECTN
Chlam Trachomatis Nucleic Acid: NEGATIVE
N.Gonorrhoeae(GC) Nucleic Acid: NEGATIVE

## 2021-08-20 NOTE — Result Encounter Note (Signed)
The HPV cervical cancer screening is negative (next in 5 years).  Gonorrhea and Chlamydia are negative.

## 2021-09-01 ENCOUNTER — Ambulatory Visit
Admission: RE | Admit: 2021-09-01 | Discharge: 2021-09-01 | Disposition: A | Discharge status: Home / Self Care | Payer: BLUE CROSS/BLUE SHIELD | Attending: Diagnostic Radiology | Admitting: Diagnostic Radiology

## 2021-09-01 DIAGNOSIS — R102 Pelvic and perineal pain: Secondary | ICD-10-CM | POA: Insufficient documentation

## 2021-09-02 NOTE — Result Encounter Note (Signed)
Dear Ms Samuel Bouche,   The ultrasound does not show any reason for the pelvic pain. It shows a very small fibroid which I think is completely irrelevant and should not cause any symptoms.  [name], MD

## 2021-09-04 ENCOUNTER — Ambulatory Visit (HOSPITAL_BASED_OUTPATIENT_CLINIC_OR_DEPARTMENT_OTHER): Payer: BLUE CROSS/BLUE SHIELD | Admitting: Obstetrics & Gynecology

## 2021-12-11 ENCOUNTER — Encounter (INDEPENDENT_AMBULATORY_CARE_PROVIDER_SITE_OTHER): Payer: Self-pay | Admitting: Family Medicine

## 2021-12-11 NOTE — Telephone Encounter (Signed)
Routing to Dr Hebert Soho for advise- pt requests order for TSH    Last thyroid test 01/11/21

## 2021-12-11 NOTE — Telephone Encounter (Signed)
Scheduled.  Closing TE.

## 2021-12-11 NOTE — Telephone Encounter (Signed)
Help schedule wellness visit. We will do labs then  Maud Deed, MD

## 2021-12-18 ENCOUNTER — Other Ambulatory Visit: Payer: Self-pay

## 2021-12-29 ENCOUNTER — Other Ambulatory Visit (INDEPENDENT_AMBULATORY_CARE_PROVIDER_SITE_OTHER): Payer: Self-pay | Admitting: Family Medicine

## 2021-12-29 DIAGNOSIS — E038 Other specified hypothyroidism: Secondary | ICD-10-CM

## 2021-12-30 ENCOUNTER — Encounter (INDEPENDENT_AMBULATORY_CARE_PROVIDER_SITE_OTHER): Payer: BLUE CROSS/BLUE SHIELD | Admitting: Family Medicine

## 2021-12-30 MED ORDER — LEVOTHYROXINE SODIUM 75 MCG OR TABS
ORAL_TABLET | ORAL | 0 refills | Status: DC
Start: 2021-12-30 — End: 2022-03-30

## 2022-01-08 ENCOUNTER — Ambulatory Visit (INDEPENDENT_AMBULATORY_CARE_PROVIDER_SITE_OTHER): Payer: BLUE CROSS/BLUE SHIELD | Admitting: Family Medicine

## 2022-01-08 VITALS — BP 116/77 | HR 88 | Temp 98.0°F | Resp 16 | Ht 61.22 in | Wt 144.3 lb

## 2022-01-08 DIAGNOSIS — E063 Autoimmune thyroiditis: Secondary | ICD-10-CM

## 2022-01-08 DIAGNOSIS — R200 Anesthesia of skin: Secondary | ICD-10-CM

## 2022-01-08 DIAGNOSIS — Z23 Encounter for immunization: Secondary | ICD-10-CM

## 2022-01-08 DIAGNOSIS — R202 Paresthesia of skin: Secondary | ICD-10-CM

## 2022-01-08 DIAGNOSIS — E038 Other specified hypothyroidism: Secondary | ICD-10-CM

## 2022-01-08 DIAGNOSIS — Z0000 Encounter for general adult medical examination without abnormal findings: Secondary | ICD-10-CM

## 2022-01-08 DIAGNOSIS — L508 Other urticaria: Secondary | ICD-10-CM

## 2022-01-08 MED ORDER — INFLUENZA VAC SUBUNIT QUAD 0.5 ML IM SUSY
0.5000 mL | PREFILLED_SYRINGE | Freq: Once | INTRAMUSCULAR | Status: AC
Start: 2022-01-08 — End: 2022-01-08
  Administered 2022-01-08: 0.5 mL via INTRAMUSCULAR

## 2022-01-08 MED ORDER — ZZCOVID-19 MRNA VACC (MODERNA) 50 MCG/0.5ML IM WRAPPER
50.0000 ug | Freq: Once | INTRAMUSCULAR | Status: AC
Start: 2022-01-08 — End: 2022-01-08
  Administered 2022-01-08: 50 ug via INTRAMUSCULAR

## 2022-01-08 MED ORDER — MONTELUKAST SODIUM 10 MG OR TABS
10.0000 mg | ORAL_TABLET | Freq: Every evening | ORAL | 1 refills | Status: DC
Start: 2022-01-08 — End: 2022-03-06

## 2022-01-08 NOTE — Progress Notes (Signed)
Vaccine Screening Questions    Interpreter: No    1. Are you allergic to Latex? NO    2.  Have you had a serious reaction or an allergic reaction to a vaccine?  NO    3.  Currently have a moderate or severe illness, including fever?  NO    4.  Ever had a seizure or any neurological problem associated with a vaccine? (DTaP/TDaP/DTP pertinent) NO    5.  Is patient receiving any live vaccinations today? (Varicella-Chickenpox, MMR-Measles/Mumps/Rubella, Zoster-Shingles, Flumist, Yellow Fever) NOTE: oral rotavirus is exempt  NO    If YES to any of the questions above - Do NOT give vaccine.  Consult with RN or provider in clinic.  (#5 can be YES if all Live vaccine questions are answered NO)    If NO to all questions above - Patient may receive vaccine.    6. Are you pregnant or is there a chance you could become pregnant during the next month (HPV pertinent) NO    7. Do you need to receive the Flu vaccine today? YES - Additional Flu Questions  Flu Vaccine Screening Questions:    Ever had Guillain-Barre syndrome associated with a vaccine? NO    Less than 6 months old? NO    If YES to any of the Flu questions above - NO Flu Vaccine to be given.  Patient may consult provider as needed.    If NO to all questions above - Patient may receive Flu Shot (IM)    Is the patient requesting Flumist? NO    If between 6 months and 39 years of age, was flu vaccine received last year?  NO  If NO to above question:   Children who are receiving influenza vaccine for the first time - administer 2 doses of the current influenza vaccine (separated by at least 4 weeks).      All patients are encouraged to wait 15 minutes before leaving after receiving any vaccine.    VIS given 01/08/2022 by Golden Circle, CMA.      Vaccine given today without initial adverse effect. YES    Golden Circle, CMA     Are you feeling sick today? No   Have you ever received a dose of COVID 19 vaccine? Yes   If yes, which vaccine have you received last? Moderna  When  was your last COVID vaccine dose? 12/29/19  Have you ever had a severe allergic reaction (e.g. anaphylaxis) to anything? For example, a reaction for which you were treated with epinephrine (EpiPen) or for which you had to go to the hospital? No   Was the severe allergic reaction after receiving a COVID 19 vaccine? No   Was the severe allergic reaction after receiving another vaccine or another injectable medication? No   Do you have a physical vaccination card (compare with electronic record)? No   Do you give consent today to receive a COVID 19 vaccination? Yes

## 2022-01-08 NOTE — Patient Instructions (Signed)
Recommendations for Pap Smear screening age 33-65    Pap smear every 3 years OR pap smear with HPV screening every 5 years  Exceptions:   if you have ever been treated for moderate or severe dysplasia of the cervix and have completed post-treatment surveillance, you can return to routine screening for your age group, and continue Pap screening for at least twenty years.  If you have a suppressed immune system you may need more frequent screening.  Discontinue screening at age 65 if there have been three consecutive normal Pap smears or negative HPV test.  If you are sexually active, you should have a yearly screening for Chlamydia, HIV, and other sexually transmitted infections as appropriate  If you are using contraception, you should see your provider yearly for symptom review, blood pressure check (if using a hormonal method), and exam if needed.

## 2022-01-08 NOTE — Progress Notes (Signed)
Meagan Coffey  Chief Complaint   Patient presents with    Wellness    Flu Shot         Meagan Coffey is a 31 year old female here today for a preventive health visit.   Other problems or concerns today:     1) Behavioral health  Sees a ARNP psych for management of her behavioral health. She manages her attention symptoms and ? Mood d/o. Started wellbutrin for her mood recently. Anxiety has been worse.    2) Hives/chronic urticaria: she is using 2 different antihistamines : cetirizine and claritin. Diagnosed at age 1. Saw an immunologist for years. Was on montelukast and pepcid and cetirizine at one point. Still needed prednisone for flares. Has not needed pred for some yrs. Not interested in seeing allergy right now. Would like to restart montelukast    3) tingling of her toes and soles of the feet for about 6 mo, worse with walking and sitting. Not present at night. Does not wake her up    4) Hypothyroidism  Wondering if her dose maybe too high which is why she is feeling anxious.    GYN HISTORY  OB History   Gravida Para Term Preterm AB Living   2 0 0 0 2 0   SAB IAB Ectopic Multiple Live Births   1 1 0 0 0       Currently having periods: YES  Patient's last menstrual period was 12/28/2021.   Periods are regular q 28-30 days, lasting 5 days.    Crampy, lower abdominal pain during periods: mild  Cyclic symptoms: none  Intermenstrual bleeding, spotting,or discharge: No   Last pap: 07/2021  Pap history: no history of abnormal paps  Other gyn history: none    SEXUAL HISTORY  Sexual activity: yes, single partner, contraception - none and actively trying to conceive  Age at first intercourse: Not asked today  History of STDs: none known  Number of sex partners in the past year: 1  Last STD check:   New partner(s) since last STD check: No    Sexual concerns: No      CANCER SCREENING  Family History       Problem (# of Occurrences) Relation (Name,Age of Onset)    Cancer (1) Mother    Depression (1) Sister    Lung  Cancer (1) Mother (72)    Lupus (1) Maternal Aunt           Negative family history of: Colorectal Cancer          Father had a stroke at 36. Smoker and overweight    Family history of colon cancer: NO  Family history of uterine or ovarian cancer: NO  Family history of breast cancer: NO  Prior mammogram: NO  History of abnormal mammogram: N/A    LIFESTYLE  Current dietary habits: could do better  Calcium: dietary sources only  Current exercise habits: running/ jogging and peloton  Regular seat belt use: YES  Substance use:  reports that she quit smoking about 8 years ago. She started smoking about 18 years ago. She smoked an average of .5 packs per day. She has never used smokeless tobacco. She reports current alcohol use. She reports current drug use. Drug: Marijuana.  Exposure to hazardous materials: Not asked today  Guns in the house: No    Review Of Systems  Regular dental exams: yes  UTD with eye exam    EXAM:  BP 116/77  Pulse 88   Temp 36.7 C (Temporal)   Resp 16   Ht 5' 1.22" (1.555 m)   Wt 65.5 kg (144 lb 4.8 oz)   LMP 12/28/2021   SpO2 98%   BMI 27.07 kg/m   Body mass index is 27.07 kg/m.  General: healthy, alert, flat affect  Head: Normocephalic. No masses, lesions, tenderness or abnormalities  Neck: supple. No adenopathy. Thyroid symmetric, normal size, without nodules  Lungs: Clear to auscultation bilaterally  Heart: normal rate, regular rhythm  Skin: Skin color, texture, turgor normal. No rashes or concerning lesions  Abdomen: soft, non-tender. BS normal. No masses or organomegaly  Neuro:  Grossly normal to observation, gait normal  Breasts: No obvious deformity or mass to inspection, no dimpling or puckering with active contraction of the pectoralis, nipples everted bilaterally, no skin lesion or nipple discharge, no mass palpated, no axillary lymphadenopathy  Pelvic exam: deferred    Foot exam:     - Visual inspection:  normal to inspection, with intact skin, no significant callus  formation, no evidence of ischemia   - Monofilament exam:  decreased LEFT 2nd toe   - Pulse exam: normal    - Vibration 128-Hz tuning fork:  not done   - Pinprick sensation: not done   - Ankle reflexes:  normal       ASSESSMENT/PLAN:  1. Need for vaccination  given  - influenza quadrivalent PF MDCK vaccine (Flucelvax) syringe 0.5 mL    2. Hypothyroidism due to Hashimoto's thyroiditis  Check TSH and refill meds after I see labs    3. Encounter for wellness examination  Check labs today  - Lipid Panel; Future  - TSH w/Reflexive Free T4; Future  - Comprehensive Metabolic Panel; Future  - Comprehensive Metabolic Panel  - TSH w/Reflexive Free T4  - Lipid Panel    4. Numbness and tingling of both legs  Check labs  If normal consider EMG  Discussed keeping feet warm and good footwear  - Vitamin B12 (Cobalamin); Future  - Magnesium; Future  - Ferritin; Future  - Ferritin  - Magnesium  - Vitamin B12 (Cobalamin)    5. Encounter for general adult medical examination without abnormal findings      6. Chronic urticaria  Restart montelukast  Stick to one antihistamine  Consider BID dosing of antihistamine and added pepcid when she has a flare.  - montelukast 10 MG tablet; Take 1 tablet (10 mg) by mouth every evening.  Dispense: 30 tablet; Refill: 1    Health Maintenance   Topic Date Due    Hepatitis B Vaccine (1 of 3 - 3-dose series) Never done    HIV Screening  Never done    Depression Screening (PHQ-2)  12/10/2021    Cervical Cancer Screening  08/15/2026    DTaP, Tdap and Td Vaccines (3 - Td or Tdap) 12/08/2027    Influenza Vaccine  Completed    Hepatitis C Screening  Completed    COVID-19 Vaccine  Completed    Hepatitis A Vaccine  Aged Out    Pneumococcal Vaccine: Pediatrics (0-5 years) and At-Risk Patients (6-64 years)  Aged Out    HPV Vaccine  Aged Out       Immunizations or studies due: as ordered below  Chlamydia screening offered: declined  HIV screening offered: declined  HPV vaccine offered: Previously  immunized    Preventive counseling: health maintenance  Follow-up: 2-27mo to check on her tingling and hives

## 2022-01-09 LAB — COMPREHENSIVE METABOLIC PANEL
ALT (GPT): 10 U/L (ref 7–33)
AST (GOT): 15 U/L (ref 9–38)
Albumin: 4.5 g/dL (ref 3.5–5.2)
Alkaline Phosphatase (Total): 56 U/L (ref 25–100)
Anion Gap: 9 (ref 4–12)
Bilirubin (Total): 0.6 mg/dL (ref 0.2–1.3)
Calcium: 10 mg/dL (ref 8.9–10.2)
Carbon Dioxide, Total: 28 meq/L (ref 22–32)
Chloride: 102 meq/L (ref 98–108)
Creatinine: 0.6 mg/dL (ref 0.38–1.02)
Glucose: 86 mg/dL (ref 62–125)
Potassium: 4.5 meq/L (ref 3.6–5.2)
Protein (Total): 7.4 g/dL (ref 6.0–8.2)
Sodium: 139 meq/L (ref 135–145)
Urea Nitrogen: 7 mg/dL — ABNORMAL LOW (ref 8–21)
eGFR by CKD-EPI 2021: >60 mL/min/{1.73_m2} (ref >59)

## 2022-01-09 LAB — LIPID PANEL
Cholesterol/HDL Ratio: 2.9
HDL Cholesterol: 74 mg/dL (ref >39)
LDL Cholesterol, NIH Equation: 126 mg/dL (ref <130)
Non-HDL Cholesterol: 140 mg/dL (ref 0–159)
Total Cholesterol: 214 mg/dL — ABNORMAL HIGH (ref <200)
Triglyceride: 79 mg/dL (ref <150)

## 2022-01-09 LAB — TSH WITH REFLEXIVE FREE T4: TSH with Reflexive Free T4: 1.247 u[IU]/mL (ref 0.400–5.000)

## 2022-01-09 LAB — FERRITIN: Ferritin: 26 ng/mL (ref 10–180)

## 2022-01-09 LAB — MAGNESIUM: Magnesium: 1.7 mg/dL — ABNORMAL LOW (ref 1.8–2.4)

## 2022-01-09 LAB — VITAMIN B12 (COBALAMIN): Vitamin B12 (Cobalamin): 236 pg/mL (ref 180–914)

## 2022-02-11 ENCOUNTER — Ambulatory Visit: Payer: Self-pay | Admitting: Family Medicine

## 2022-02-11 NOTE — Telephone Encounter (Signed)
Pt reports testing positive for covid-19 on Sunday and developed nasal congestion and low grade temperature, last reported temperature today 100.3 F.  Pt is concerned that last night tinnitus R ear "pulsating, loud shrill rining" disrupts sleep and recurred today. Pt reports occasional dizziness. NAD at time of triage. No SOB/chest pain/dizziness at time of call.     Plan: Advised patient for evaluation within 3 days. Pt states she will go to urgent care for further evaluation due to positive covid-19 status. Care advise reviewed. Questions answered. Advised patient to call 911 or go to closest emergency room if life threatening symptoms occur. Pt v/u and agreeable to plan.     Reason for Disposition  . Symptoms only or mainly in 1 ear (unilateral tinnitus)    Additional Information  . Negative: Followed an ear injury  . Negative: Hearing loss is main symptom  . Negative: Patient sounds very sick or weak to the triager  . Negative: Taking aspirin and dosage sounds high (i.e., > 1500 mg/day)  . Negative: Ear is painful  . Negative: Decreased hearing followed sudden, extremely loud noise (e.g., explosion, not just loud concert)  . Negative: Taking medication that can damage hearing (i.e., gentamycin, tobramycin, furosemide, ethacrynic acid, cisplatin, quinidine)  . Negative: MODERATE-SEVERE tinnitus (i.e., interferes with work, school, or sleep)  . Negative: Hearing loss in one or both ears and sudden onset and present now    Protocols used: Tinnitus-ADULT-OH

## 2022-02-11 NOTE — Telephone Encounter (Signed)
Patient has been experiencing pulsatile tinnitus and is covid positive. Unsure if it is cause for concern. Would like to speak with RN for advise.

## 2022-03-05 ENCOUNTER — Other Ambulatory Visit (INDEPENDENT_AMBULATORY_CARE_PROVIDER_SITE_OTHER): Payer: Self-pay | Admitting: Family Medicine

## 2022-03-05 DIAGNOSIS — L508 Other urticaria: Secondary | ICD-10-CM

## 2022-03-06 MED ORDER — MONTELUKAST SODIUM 10 MG OR TABS
10.0000 mg | ORAL_TABLET | Freq: Every evening | ORAL | 8 refills | Status: DC
Start: 2022-03-06 — End: 2022-12-24

## 2022-03-27 ENCOUNTER — Other Ambulatory Visit (INDEPENDENT_AMBULATORY_CARE_PROVIDER_SITE_OTHER): Payer: Self-pay | Admitting: Family Medicine

## 2022-03-27 DIAGNOSIS — R5383 Other fatigue: Secondary | ICD-10-CM

## 2022-03-27 DIAGNOSIS — E038 Other specified hypothyroidism: Secondary | ICD-10-CM

## 2022-03-30 MED ORDER — LEVOTHYROXINE SODIUM 75 MCG OR TABS
ORAL_TABLET | ORAL | 0 refills | Status: DC
Start: 2022-03-30 — End: 2022-03-31

## 2022-03-30 NOTE — Telephone Encounter (Signed)
Sent mychart message with scheduling ticket     CCR upon return call please schedule RL 30 min medication review with provider

## 2022-03-30 NOTE — Telephone Encounter (Signed)
Patient last seen on 01/08/22 and was to return in 2 to 3 months.  One refill authorized.  Please schedule follow up visit.

## 2022-03-31 MED ORDER — LEVOTHYROXINE SODIUM 75 MCG OR TABS
75.0000 ug | ORAL_TABLET | Freq: Every day | ORAL | 3 refills | Status: DC
Start: 2022-03-31 — End: 2022-07-02

## 2022-03-31 NOTE — Telephone Encounter (Signed)
Routing to provider for review and advisement.

## 2022-03-31 NOTE — Telephone Encounter (Signed)
Routing to Dr Prakasam to review replied

## 2022-03-31 NOTE — Addendum Note (Signed)
Addended by: Cecille Aver U on: 03/31/2022 10:10 AM     Modules accepted: Orders

## 2022-04-01 NOTE — Telephone Encounter (Signed)
FYI to Dr Prakasam

## 2022-04-01 NOTE — Addendum Note (Signed)
Addended by: Maud Deed on: 04/01/2022 09:25 AM     Modules accepted: Orders

## 2022-04-17 ENCOUNTER — Other Ambulatory Visit (INDEPENDENT_AMBULATORY_CARE_PROVIDER_SITE_OTHER): Payer: Self-pay | Admitting: Family Medicine

## 2022-04-17 DIAGNOSIS — R5383 Other fatigue: Secondary | ICD-10-CM

## 2022-04-18 LAB — MAGNESIUM: Magnesium: 1.9 mg/dL (ref 1.8–2.4)

## 2022-04-18 LAB — VITAMIN B12 (COBALAMIN): Vitamin B12 (Cobalamin): 323 pg/mL (ref 180–914)

## 2022-07-02 ENCOUNTER — Other Ambulatory Visit (INDEPENDENT_AMBULATORY_CARE_PROVIDER_SITE_OTHER): Payer: Self-pay | Admitting: Family Medicine

## 2022-07-02 DIAGNOSIS — E038 Other specified hypothyroidism: Secondary | ICD-10-CM

## 2022-07-02 MED ORDER — LEVOTHYROXINE SODIUM 75 MCG OR TABS
75.0000 ug | ORAL_TABLET | Freq: Every day | ORAL | 0 refills | Status: DC
Start: 2022-07-02 — End: 2022-08-13

## 2022-07-02 NOTE — Telephone Encounter (Signed)
This medication is outside of the Refill Center's protocols. Please sign and close the encounter if you approve:Levothyroxine      Patient is asking for prescription to be sent to a pharmacy in [place]    If this medication is denied please have your staff inform the patient.

## 2022-07-02 NOTE — Telephone Encounter (Signed)
Pt's RX placed on Dr. Ileene Patrick desk.

## 2022-07-02 NOTE — Telephone Encounter (Signed)
Leave on my desk for sign then we can fax  York Ram, MD

## 2022-07-03 NOTE — Telephone Encounter (Signed)
Tiaria Joella Prince   to [facility] Refill Authorization [facility] Pool (supporting York Ram, MD)       07/03/22  5:50 PM  Is there anything I can do to assist?      There are several [facility] pharmac[facility] in my vicinity. Here is the address [address], [place] [address], [place] and phone number [phone] of one nearby my hotel.     Should I call my normal pharmacy? I'm very worried about missing it.

## 2022-08-11 ENCOUNTER — Other Ambulatory Visit (INDEPENDENT_AMBULATORY_CARE_PROVIDER_SITE_OTHER): Payer: Self-pay | Admitting: Family Medicine

## 2022-08-11 DIAGNOSIS — E038 Other specified hypothyroidism: Secondary | ICD-10-CM

## 2022-08-13 MED ORDER — LEVOTHYROXINE SODIUM 75 MCG OR TABS
75.0000 ug | ORAL_TABLET | Freq: Every day | ORAL | 1 refills | Status: DC
Start: 2022-08-13 — End: 2022-12-30

## 2022-12-15 ENCOUNTER — Other Ambulatory Visit: Payer: Self-pay

## 2022-12-24 ENCOUNTER — Encounter (INDEPENDENT_AMBULATORY_CARE_PROVIDER_SITE_OTHER): Payer: Self-pay | Admitting: Family Medicine

## 2022-12-24 ENCOUNTER — Telehealth (INDEPENDENT_AMBULATORY_CARE_PROVIDER_SITE_OTHER): Payer: BLUE CROSS/BLUE SHIELD | Admitting: Family Medicine

## 2022-12-24 DIAGNOSIS — E063 Autoimmune thyroiditis: Secondary | ICD-10-CM

## 2022-12-24 DIAGNOSIS — F900 Attention-deficit hyperactivity disorder, predominantly inattentive type: Secondary | ICD-10-CM

## 2022-12-24 DIAGNOSIS — L508 Other urticaria: Secondary | ICD-10-CM

## 2022-12-24 DIAGNOSIS — Z3A01 Less than 8 weeks gestation of pregnancy: Secondary | ICD-10-CM

## 2022-12-24 NOTE — Progress Notes (Signed)
Assessment & Plan.      Hypothyroidism due to Hashimoto's thyroiditis  Will check TSH and titrate the dose to get TSH < 2.5  - TSH with Reflexive Free T4; Future  - Magnesium; Future    Chronic urticaria  Uses cetirizine PRN. Off montelukast    Attention deficit hyperactivity disorder (ADHD), predominantly inattentive type  Off Mydagis    Less than [redacted] weeks gestation of pregnancy [facility])  Will check with Dr Marice Potter about OB care and where she should schedule this appt         Follow-up: No follow-ups on file.       Subjective:     Meagan Coffey is a 32 year old female who presents on 12/24/2022. She had concerns including Thyroid Recheck (Pt is pregnant and need to check her TSH level).    Here to discuss positive home pregnancy test. Planned pregnancy  Had her IUD removed in 2021and spouse and her have been trying to conceive since then. Took a home pregnancy test due to amenorrhea x 2 days and this was very positive.   Symptoms: fatigue, nausea with emesis. Some breast tenderness but none today and concerned why.  No spotting/bleeding/ discharge  G3P0:  ObHx  G1 ectopic, treated surgically, 2010  G2 medical TOP 2011  G3 7 week SAB 08/2018    Patient's last menstrual period was 11/20/2022 (exact date).      She is on levothyroxine daily for hypothyroidism and Mydagis and Wellbutrin from her psychiatrist for ADHD and depression. She has stopped Mydagis. Read about wellbutrin in pregnancy and leaning towards stopping this. Meets her psychiatrist next week.    She would like to deliver her baby with Dr Marice Potter and is okay to come to [facility] for this.          Objective: Marland Kitchen     Vitals: LMP 11/20/2022 (Exact Date)       Physical Exam  Constitutional:       Appearance: Normal appearance.   Pulmonary:      Effort: Pulmonary effort is normal.      Comments: Speaking full sentences  Neurological:      Mental Status: She is alert.

## 2022-12-28 ENCOUNTER — Other Ambulatory Visit (INDEPENDENT_AMBULATORY_CARE_PROVIDER_SITE_OTHER): Payer: Self-pay | Admitting: Family Medicine

## 2022-12-28 DIAGNOSIS — E063 Autoimmune thyroiditis: Secondary | ICD-10-CM

## 2022-12-28 DIAGNOSIS — Z3A01 Less than 8 weeks gestation of pregnancy: Secondary | ICD-10-CM

## 2022-12-29 LAB — TSH WITH REFLEXIVE FREE T4: Thyroid Stimulating Hormone: 2.864 u[IU]/mL (ref 0.400–5.000)

## 2022-12-29 LAB — MAGNESIUM: Magnesium: 1.9 mg/dL (ref 1.8–2.4)

## 2022-12-29 NOTE — Progress Notes (Signed)
Routing to Dr Prakasam for advise  Pharmacy pended

## 2022-12-30 ENCOUNTER — Telehealth (HOSPITAL_BASED_OUTPATIENT_CLINIC_OR_DEPARTMENT_OTHER): Payer: Self-pay | Admitting: Obstetrics & Gynecology

## 2022-12-30 MED ORDER — LEVOTHYROXINE SODIUM 88 MCG OR TABS
88.0000 ug | ORAL_TABLET | Freq: Two times a day (BID) | ORAL | 0 refills | Status: DC
Start: 2023-01-02 — End: 2023-01-25

## 2022-12-30 MED ORDER — LEVOTHYROXINE SODIUM 75 MCG OR TABS
75.0000 ug | ORAL_TABLET | ORAL | 0 refills | Status: DC
Start: 2022-12-30 — End: 2023-01-08

## 2022-12-30 NOTE — Telephone Encounter (Signed)
RETURN CALL: Voicemail - Detailed Message      SUBJECT:  Appointment Request     REASON FOR VISIT: OB  PREFERRED DATE/TIME: flexible  REASON UNABLE TO APPOINT: per patient    Patient is calling to re-establish care with Dr Marice Potter since she is pregnant and Dr Marice Potter delivered her last child.     [facility] unable to schedule, no availability.     First day of patient's [facility]: 11/20/2022    Please contact patient to assist.   PHONE: [phone] can LDVM

## 2022-12-30 NOTE — Addendum Note (Signed)
 Addended by: York Ram on: 12/30/2022 09:44 AM     Modules accepted: Orders

## 2022-12-31 NOTE — Telephone Encounter (Signed)
 Attempted to reach patient, lvm for patient to call back for appointment.

## 2023-01-07 ENCOUNTER — Encounter (HOSPITAL_BASED_OUTPATIENT_CLINIC_OR_DEPARTMENT_OTHER): Payer: Self-pay | Admitting: Obstetrics & Gynecology

## 2023-01-07 NOTE — Progress Notes (Signed)
 NEW OB OFFICE VISIT    CC:  32 year old G4P0030  at [redacted]w[redacted]d by LMP who presents for a new OB visit.      HPP:   Cyrstal presents with her husband Jill Alexanders today to verify pregnancy viability.  This is a planned and desired pregnancy.  Have been attempting to conceive for 3 years.  Feels a little tired.  No bleeding.  Still with some chronic RLQ pain.   Increased levothyroxine to 88 mcg on the weekends two weeks ago  Has appointment with perinatal psychiatrist coming up.  Reports today she feels "calm".  Stopped all meds.  Denies any depressed or anxious feelings, although a little nervous about today's [place].    Review of Dates:  LMP 11/20/22   --> EDD 08/27/23   Ultrasound  01/08/23 --->  EDD 08/29/23  --> EGA  [redacted]w[redacted]d   Final EDD 08/27/23    Problem List   # Hypothyroidism due to Hashimoto's thyroiditis  # Depression/ADHD    Immunizations:   Influenza 12/16/2022  Tdap (27-36 weeks)  COVID 12/16/2022    Past OB History    G1 ectopic, treated surgically, 2010  G2 medical TOP 2011  G3 7 week SAB 08/2018    Past GYN History   Menses: Regular q27-28 days  History of STIs: CT 14 years ago  Last pap: 2023 HPV neg  Pap history: no history of abnormal paps  Gyn surgeries: Uterine septum resection 2021  Prior contraception: none     Patient Active Problem List    Diagnosis Date Noted    Urticaria [L50.9] 12/30/2018    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] 12/22/2017    History of alcohol abuse [F10.11] 12/22/2017    Anxiety [F41.9] 10/22/2017    Attention deficit hyperactivity disorder (ADHD), predominantly inattentive type [F90.0] 10/22/2017    Screening for malignant neoplasm of cervix [Z12.4] 10/22/2017     Last pap 11/13/2016-repeat in 3 years      H/O iron deficiency anemia [Z86.2] 10/22/2017    Hypothyroidism due to Hashimoto's thyroiditis [E06.3] 09/29/2017     Diagnosed 10/2016      Hair thinning [L65.9] 09/29/2017    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] 09/29/2017       Past Medical History:  Past Medical History:    Diagnosis Date    Anxiety     Attention deficit disorder (ADD)     Depression     Substance abuse (HCC)     Thyroid disease        Past Surgical History:  Past Surgical History:   Procedure Laterality Date    NO PRIOR SURGERIES         Medications:    Current Outpatient Medications:     cetirizine 5 MG tablet, Take by mouth., Disp: , Rfl:     FISH OIL , Take by mouth., Disp: , Rfl:     levothyroxine 75 MCG tablet, Take 1 tablet (75 mcg) by mouth every Monday, Tuesday, Wednesday, Thursday, and Friday., Disp: 40 tablet, Rfl: 0    levothyroxine 88 MCG tablet, Take 1 tablet (88 mcg) by mouth 2 times a day on Saturday and Sunday., Disp: 32 tablet, Rfl: 0    Prenatal Vit-Fe Fumarate-FA (PRENATAL FORMULA) 27-1 MG Oral Tablet, Take 1 tablet by mouth daily., Disp: 100 tablet, Rfl: 3    Allergies:  Review of patient's allergies indicates:  Allergies   Allergen Reactions    Penicillins Fever, Skin: Rash and GI: Pain/diarrhea  Family History:    Family History       Problem (# of Occurrences) Relation (Name,Age of Onset)    Cancer (1) Mother    Depression (1) Sister    Lung Cancer (1) Mother (45)    Lupus (1) Maternal Aunt           Negative family history of: Colorectal Cancer        Inherited or Genetic Conditions: none   Family History of DM: great grandma type 2 DM     Social History:  Social History     Tobacco Use    Smoking status: Former     Current packs/day: 0.00     Average packs/day: 0.5 packs/day for 10.6 years (5.3 ttl pk-yrs)     Types: Cigarettes     Start date: 03/03/2003     Quit date: 10/16/2013     Years since quitting: 9.2    Smokeless tobacco: Never   Substance Use Topics    Alcohol use: Yes     Comment: occ    Drug use: Yes     Types: Marijuana     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  Neck: Supple, no adenopathy, thyroid symmetric, normal size,  without nodules.  Cardiac: RRR, no m/r/g  Respiratory: Normal respiratory effort and chest wall movement with respiration. CTA B  Psychiatric:   Mood/affect:  Normal.  Orientation: oriented to time, person and place  Neurologic:  Gait:  Normal.  Skin: Skin color, texture, turgor normal. No rashes or concerning lesions on visible areas.  Pelvic Exam: External genitalia normal, normal bartholin/skene/urethral meatus/anus., Vagina is rugated and well-estrogenized. A vaginal swab for GC/CT was collected.     Transvaginal ultrasound: Single intrauterine pregnancy confirmed.  CRL  consistent with 6wdd   + fetal cardiac motion.    No adnexal abnormalities noted.  (See uploaded images in PACS)     A&P: 32 year old G4P0030 at [redacted]w[redacted]d by LMP=7 week [place] here for new OB visit.    # OB   -IUP Confirmed: on [place] today  -Prenatal labs will draw with cfDNA    -Continue prenatal vitamins.  -Pre-pregnancy BMI: 28 recommended 25-35 pounds of weight gain this pregnancy     # Dating: Fetus with dating by LMP, consistent with 7 wk ultrasound.     # Prenatal Screening for aneuploidy and NTD explained. Discussed options for prenatal diagnosis including both non-invasive and invasive screening. Discussed and offered carrier screening.   -Patient would like: all screening  -CF, SMA, hemoglobinopathy testing: ordered today  - plan NT [place] and cfDNA     # Prenatal Teaching:  -seen by RN today    # Hypothyroid:   - TSH 2.8 12/28/22, dose recently increased to 88 mcg daily only on weekends; counseled to take daily  - plan to check TSH in 3-4 weeks and increase again prn    # Depression:   - self discontinued all medicines.  Has an appointment scheduled with a perinatal psychologist outside of [place] system  - encouraged to monitor mood closely and let us know if she would like to resume medicines    # ADHD  - recently discontinued Mydagis     # Prenatal care:   - oriented to our [facility] academic group practice with delivery at [facility].  Patient lives in [place].   Considering her options for [facility].  Scheduled next visit with me 02/26/23.  Patient will let us know if she wishes to transfer care.    Follow-up planned:

## 2023-01-08 ENCOUNTER — Ambulatory Visit (HOSPITAL_BASED_OUTPATIENT_CLINIC_OR_DEPARTMENT_OTHER): Payer: BLUE CROSS/BLUE SHIELD

## 2023-01-08 ENCOUNTER — Ambulatory Visit (HOSPITAL_COMMUNITY): Payer: Self-pay

## 2023-01-08 ENCOUNTER — Ambulatory Visit (HOSPITAL_BASED_OUTPATIENT_CLINIC_OR_DEPARTMENT_OTHER): Payer: BLUE CROSS/BLUE SHIELD | Admitting: Clinical Social Worker

## 2023-01-08 ENCOUNTER — Other Ambulatory Visit (HOSPITAL_BASED_OUTPATIENT_CLINIC_OR_DEPARTMENT_OTHER): Payer: Self-pay | Admitting: Obstetrics & Gynecology

## 2023-01-08 ENCOUNTER — Ambulatory Visit: Payer: BLUE CROSS/BLUE SHIELD | Attending: Obstetrics & Gynecology | Admitting: Obstetrics & Gynecology

## 2023-01-08 VITALS — BP 131/69 | HR 91 | Temp 99.0°F | Ht 61.22 in | Wt 150.0 lb

## 2023-01-08 DIAGNOSIS — O99281 Endocrine, nutritional and metabolic diseases complicating pregnancy, first trimester: Secondary | ICD-10-CM

## 2023-01-08 DIAGNOSIS — E039 Hypothyroidism, unspecified: Secondary | ICD-10-CM

## 2023-01-08 DIAGNOSIS — N926 Irregular menstruation, unspecified: Secondary | ICD-10-CM | POA: Insufficient documentation

## 2023-01-08 DIAGNOSIS — O0991 Supervision of high risk pregnancy, unspecified, first trimester: Secondary | ICD-10-CM | POA: Insufficient documentation

## 2023-01-08 DIAGNOSIS — Z3A01 Less than 8 weeks gestation of pregnancy: Secondary | ICD-10-CM

## 2023-01-08 NOTE — Progress Notes (Signed)
 [facility]: OB Navigator Intake Note  01/08/2023    Brief description: OB Navigator met with Pt today for NOB OB nav appointment. Ob Navigator introduced self and description of role and assessed for barriers to prenatal care.     Patient description, presenting problem, patient / family goals: 32 year old English speaking female pt receiving medical care in OB/GYN clinic.    Intervention / Outcome / Plan: Pt was present today with her husband Jill Alexanders and reported feeling happy and good and a little nervous as she had a miscarriage a few years ago and they have been trying to get pregnant.   -Pt did not have any significant worries or concerns today.   -Pt would like support in parenting classes and building community and social supports.  OB navigator will send pt information via MyChart.   -Pt appears able to independently advocate for her needs during this pregnancy and to reach out with need of further support.  -OB navigator provided the patients with contact information.     - OB Navigator remains available for continued support as needed throughout the remainder of pregnancy; plans to follow up in 3rd trimester.       Gestational age of fetus: [redacted]w[redacted]d  Estimated date of delivery: Aug 27, 2023    Other members of household:  Husband/father of baby  Living situation: stable housing    Support system:   - Patient Education: discussed ways to increase support  - Outcomes: reports poor/lack of social support   -Pt reported she has support from Husbands mother and sister who live in [place], but not a lot of support here in [place] and would like more ways to build support and community.   OB Navigator discussed extra social supports with pt such as: Doula, PEPS     Income source: income from employment and income from spouse employment     Medical coverage: \  Insurance Information                  BCBS OUT OF [place] [employer] [place] Phone: --    Subscriber: [place]anc[place], [place] Subscriber#: [id number]     Group#: [id number] Precert#: --    Authorization#: -- Effective Date: 03/02/21          Access to food: has adequate food  Eligible for [facility]? No   - Patient Education: Not discussed during visit  - Outcomes: Not discussed during visit    Eligible for Nurse-Family Partnership? No   - Patient Education: Not discussed during visit  - Outcomes: Not discussed during visit    Transportation: drives or gets rides    Childcare: N/A (does not have children)    Alcohol/Drugs:   - Outcomes: client denies use of alcohol or drugs    Personal/family safety: Ob Navigator unable to assess.       Perinatal psychological changes/mental health:   - Patient Education: evaluated feelings/thoughts of pregnancy/parenting, evaluated awareness of impact of baby on parent, and introduced topic of mental health (MH) in perinatal period  - Outcomes: expresses thoughts/feelings about pregnancy/parenting, verbalizes understanding of MH in perinatal period, and denies MH issues/concerns  -Pt is currently enrolled in counseling service and has been with her therapist for the past 3-4 months.    PLAN: Ob Navigator will send pt information for Parenting resources and social support via MyChart.     Maggie Font, MSW  [facility] Navigator   [facility]   [phone]

## 2023-01-09 LAB — GC&CHLAM NUCLEIC ACID DETECTN
Chlam Trachomatis Nucleic Acid: NEGATIVE
N.Gonorrhoeae(GC) Nucleic Acid: NEGATIVE

## 2023-01-10 LAB — URINE C/S: Culture: 51000 — AB

## 2023-01-15 ENCOUNTER — Encounter (HOSPITAL_BASED_OUTPATIENT_CLINIC_OR_DEPARTMENT_OTHER): Payer: Self-pay | Admitting: Obstetrics/Gynecology

## 2023-01-25 ENCOUNTER — Other Ambulatory Visit (INDEPENDENT_AMBULATORY_CARE_PROVIDER_SITE_OTHER): Payer: Self-pay | Admitting: Family Medicine

## 2023-01-25 DIAGNOSIS — Z3A01 Less than 8 weeks gestation of pregnancy: Secondary | ICD-10-CM

## 2023-01-25 DIAGNOSIS — E063 Autoimmune thyroiditis: Secondary | ICD-10-CM

## 2023-01-26 MED ORDER — LEVOTHYROXINE SODIUM 88 MCG OR TABS
88.0000 ug | ORAL_TABLET | Freq: Every day | ORAL | 0 refills | Status: DC
Start: 2023-01-26 — End: 2023-02-15

## 2023-01-26 NOTE — Telephone Encounter (Signed)
 This medication is outside of the [facility]'s protocols. Please sign and close the encounter if you approve: Levothyroxine    Please see patient comments:        Defer to PCP    If this medication is denied please have your staff inform the patient and schedule an appointment if necessary.

## 2023-02-03 ENCOUNTER — Other Ambulatory Visit (INDEPENDENT_AMBULATORY_CARE_PROVIDER_SITE_OTHER): Payer: Self-pay | Admitting: Family Medicine

## 2023-02-03 DIAGNOSIS — E063 Autoimmune thyroiditis: Secondary | ICD-10-CM

## 2023-02-11 ENCOUNTER — Encounter (HOSPITAL_BASED_OUTPATIENT_CLINIC_OR_DEPARTMENT_OTHER): Payer: Self-pay | Admitting: Obstetrics & Gynecology

## 2023-02-11 ENCOUNTER — Ambulatory Visit
Admit: 2023-02-11 | Discharge: 2023-02-11 | Disposition: A | Discharge status: Home / Self Care | Payer: BLUE CROSS/BLUE SHIELD | Attending: Obstetrics & Gynecology | Admitting: Obstetrics & Gynecology

## 2023-02-12 ENCOUNTER — Other Ambulatory Visit (HOSPITAL_BASED_OUTPATIENT_CLINIC_OR_DEPARTMENT_OTHER): Payer: Self-pay | Admitting: Obstetrics & Gynecology

## 2023-02-12 ENCOUNTER — Ambulatory Visit
Admission: RE | Admit: 2023-02-12 | Discharge: 2023-02-12 | Disposition: A | Discharge status: Home / Self Care | Payer: BLUE CROSS/BLUE SHIELD | Attending: Diagnostic Radiology | Admitting: Diagnostic Radiology

## 2023-02-12 DIAGNOSIS — Z3682 Encounter for antenatal screening for nuchal translucency: Secondary | ICD-10-CM | POA: Insufficient documentation

## 2023-02-12 DIAGNOSIS — Z3A12 12 weeks gestation of pregnancy: Secondary | ICD-10-CM | POA: Insufficient documentation

## 2023-02-12 DIAGNOSIS — O0991 Supervision of high risk pregnancy, unspecified, first trimester: Secondary | ICD-10-CM

## 2023-02-13 LAB — CBC (HEMOGRAM)
Hematocrit: 39 % (ref 36.0–45.0)
Hemoglobin: 13 g/dL (ref 11.5–15.5)
MCH: 29.2 pg (ref 27.3–33.6)
MCHC: 33.1 g/dL (ref 32.2–36.5)
MCV: 88 fL (ref 81–98)
Platelet Count: 292 10*3/uL (ref 150–400)
RBC: 4.45 10*6/uL (ref 3.80–5.00)
RDW-CV: 12.7 % (ref 11.0–14.5)
WBC: 8.44 10*3/uL (ref 4.3–10.0)

## 2023-02-13 LAB — THYROID STIMULATING HORMONE: Thyroid Stimulating Hormone: 2.644 u[IU]/mL (ref 0.400–5.000)

## 2023-02-15 ENCOUNTER — Other Ambulatory Visit (HOSPITAL_BASED_OUTPATIENT_CLINIC_OR_DEPARTMENT_OTHER): Payer: Self-pay | Admitting: Obstetrics & Gynecology

## 2023-02-15 DIAGNOSIS — Z3A01 Less than 8 weeks gestation of pregnancy: Secondary | ICD-10-CM

## 2023-02-15 DIAGNOSIS — E063 Autoimmune thyroiditis: Secondary | ICD-10-CM

## 2023-02-15 MED ORDER — LEVOTHYROXINE SODIUM 100 MCG OR TABS
100.0000 ug | ORAL_TABLET | Freq: Every day | ORAL | 1 refills | Status: DC
Start: 2023-02-15 — End: 2023-04-13

## 2023-02-16 LAB — HB DISORDER THALASSEMIA CARRIER SCREEN
Hemoglobin A2 Quantification by HPLC: 3 % (ref 2.3–3.5)
Hemoglobin F Quant By HPLC: <1.1 % (ref <1.1)
Thalassemia Screen Result: NORMAL

## 2023-02-19 LAB — CELL-FREE DNA PRENATAL SCREEN
cfDNA Interpretation: NOT DETECTED
cfDNA Predicted Fetal Fraction: 10 %
cfDNA Result: NEGATIVE

## 2023-02-22 LAB — SPINAL MUSCULAR ATROPHY, CARRIER TEST (SENDOUT)
SMA Copy Number, SMN1 Copies: 2
SMA Copy Number, SMN2 Copies: 1

## 2023-02-23 LAB — CYSTIC FIBROSIS DNA SCREEN: Cystic Fibrosis Results: NEGATIVE

## 2023-02-25 NOTE — Progress Notes (Signed)
 [facility] OB OFFICE VISIT    CC:  32 year old G4P0030  at [redacted]w[redacted]d by LMP=7 who presents for a return OB visit.      HPP:   Presents with her husband Jill Alexanders today.  Reports she is very tired, eating a lot (gets nauseated when hungry, then eats), no motivation, sitting on the couch, having a hard time focusing without ADHD meds and behind at work.  Feeling a bit down about feeling like this, but denies depression or hopelessness.  Did not make an appointment with perinatal psychiatrist, just didn't get around to it.  Declines BHIP referral today.  Increased levothyroxine on 02/15/23 when it was prescribed.     Review of Dates:  LMP 11/20/22   --> EDD 08/27/23   Ultrasound  01/08/23 --->  EDD 08/29/23  --> EGA  [redacted]w[redacted]d   Final EDD 08/27/23    Problem List   # Hypothyroidism due to Hashimoto's thyroiditis  # Depression/ADHD    Immunizations:   Influenza 12/16/2022  Tdap (27-36 weeks)  COVID 12/16/2022    Past OB History    G1 ectopic, treated surgically, 2010  G2 medical TOP 2011  G3 7 week SAB 08/2018    Past GYN History   Last pap: 2023 HPV neg  Gyn surgeries: Uterine septum resection 2021  Prior contraception: none     Patient Active Problem List    Diagnosis Date Noted    Urticaria [L50.9] 12/30/2018    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] 12/22/2017    History of alcohol abuse [F10.11] 12/22/2017    Anxiety [F41.9] 10/22/2017    Attention deficit hyperactivity disorder (ADHD), predominantly inattentive type [F90.0] 10/22/2017    H/O iron deficiency anemia [Z86.2] 10/22/2017    Hypothyroidism due to Hashimoto's thyroiditis [E06.3] 09/29/2017     Diagnosed 10/2016      Hair thinning [L65.9] 09/29/2017    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] 09/29/2017     Past Medical History:  Past Medical History:   Diagnosis Date    Anxiety     Attention deficit disorder (ADD)     Depression     Substance abuse (HCC)     Thyroid disease      Past Surgical History:  Past Surgical History:   Procedure Laterality Date    NO  PRIOR SURGERIES       Medications:    Current Outpatient Medications:     FISH OIL , Take by mouth., Disp: , Rfl:     levothyroxine 100 MCG tablet, Take 1 tablet (100 mcg) by mouth daily on an empty stomach., Disp: 30 tablet, Rfl: 1    Prenatal Vit-Fe Fumarate-FA (PRENATAL FORMULA) 27-1 MG Oral Tablet, Take 1 tablet by mouth daily., Disp: 100 tablet, Rfl: 3    Allergies:  Review of patient's allergies indicates:  Allergies   Allergen Reactions    Penicillins Fever, Skin: Rash and GI: Pain/diarrhea     Social History:  Social History     Tobacco Use    Smoking status: Former     Current packs/day: 0.00     Average packs/day: 0.5 packs/day for 10.6 years (5.3 ttl pk-yrs)     Types: Cigarettes     Start date: 03/03/2003     Quit date: 10/16/2013     Years since quitting: 9.3    Smokeless tobacco: Never   Substance Use Topics    Alcohol use: Yes     Comment: occ    Drug use: Yes  Types: Marijuana     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  Respiratory: Normal respiratory effort and chest wall movement with respiration.   Psychiatric:   Mood/affect:  Normal.  Orientation: oriented to time, person and place  Neurologic:  Gait:  Normal.  Skin: Skin color, texture, turgor normal. No rashes or concerning lesions on visible areas.   well-estrogenized. A vaginal swab for GC/CT was collected.     FHTs 140s    A&P: 32 year old G4P0030 at [redacted]w[redacted]d by LMP=7 week [place] here for return OB visit.    # OB   -Prenatal labs not drawn with cfDNA, will draw at [facility]   -Continue prenatal vitamins.  -Pre-pregnancy BMI: 28 recommended 25-35 pounds of weight gain this pregnancy     # Prenatal Screening   -CF, SMA, hemoglobinopathy testing: negative  - NT [place] low risk  - cfDNA negative XX  - plan serum AFP 15-20 weeks; anatomy US 18-20  weeks    # Hypothyroid:   - TSH 2.8 12/28/22, dose had recently increased to 88 mcg daily only on weekends; counseled to take daily at last visit; TSH 2.6 02/12/23, counseled to increase to 100 mcg daily  - plan to check TSH in 3-4 weeks and increase again prn; ordered today    # Depression:   - self discontinued all medicines.    - declines BHIP; encouraged to make appointment with perinatal psych    # ADHD  - recently discontinued Mydagis, having a hard time  - encouraged follow up with psych    # Transferring care: plans to transfer to [facility] (closer to home); encouraged to call today to establish care; let us know if unable to establish with them.

## 2023-02-26 ENCOUNTER — Ambulatory Visit: Payer: BLUE CROSS/BLUE SHIELD | Attending: Obstetrics & Gynecology | Admitting: Obstetrics & Gynecology

## 2023-02-26 VITALS — BP 116/63 | Wt 167.6 lb

## 2023-02-26 DIAGNOSIS — O0992 Supervision of high risk pregnancy, unspecified, second trimester: Secondary | ICD-10-CM | POA: Insufficient documentation

## 2023-02-26 DIAGNOSIS — O99282 Endocrine, nutritional and metabolic diseases complicating pregnancy, second trimester: Secondary | ICD-10-CM

## 2023-02-26 DIAGNOSIS — E039 Hypothyroidism, unspecified: Secondary | ICD-10-CM | POA: Insufficient documentation

## 2023-02-26 DIAGNOSIS — Z3A14 14 weeks gestation of pregnancy: Secondary | ICD-10-CM

## 2023-03-02 NOTE — Telephone Encounter (Signed)
 Left message for patient to call back to schedule an appointment. Ok to offer [place] 1/7@ 3:30 if 30 minute spot is still open.

## 2023-03-04 ENCOUNTER — Encounter (HOSPITAL_BASED_OUTPATIENT_CLINIC_OR_DEPARTMENT_OTHER): Payer: Self-pay | Admitting: Obstetrics/Gynecology

## 2023-04-05 ENCOUNTER — Encounter (HOSPITAL_BASED_OUTPATIENT_CLINIC_OR_DEPARTMENT_OTHER): Payer: Self-pay | Admitting: Obstetrics & Gynecology

## 2023-04-05 ENCOUNTER — Other Ambulatory Visit (HOSPITAL_BASED_OUTPATIENT_CLINIC_OR_DEPARTMENT_OTHER): Payer: Self-pay | Admitting: Obstetrics & Gynecology

## 2023-04-05 DIAGNOSIS — O0992 Supervision of high risk pregnancy, unspecified, second trimester: Secondary | ICD-10-CM

## 2023-04-06 ENCOUNTER — Other Ambulatory Visit (HOSPITAL_BASED_OUTPATIENT_CLINIC_OR_DEPARTMENT_OTHER): Payer: Self-pay | Admitting: Obstetrics & Gynecology

## 2023-04-06 ENCOUNTER — Ambulatory Visit: Payer: BLUE CROSS/BLUE SHIELD | Attending: Obstetrics & Gynecology

## 2023-04-06 DIAGNOSIS — E039 Hypothyroidism, unspecified: Secondary | ICD-10-CM | POA: Insufficient documentation

## 2023-04-06 LAB — THYROID STIMULATING HORMONE: Thyroid Stimulating Hormone: 1.377 u[IU]/mL (ref 0.400–5.000)

## 2023-04-11 ENCOUNTER — Other Ambulatory Visit (HOSPITAL_BASED_OUTPATIENT_CLINIC_OR_DEPARTMENT_OTHER): Payer: Self-pay | Admitting: Obstetrics & Gynecology

## 2023-04-11 DIAGNOSIS — E063 Autoimmune thyroiditis: Secondary | ICD-10-CM

## 2023-04-12 ENCOUNTER — Encounter (HOSPITAL_BASED_OUTPATIENT_CLINIC_OR_DEPARTMENT_OTHER): Payer: Self-pay | Admitting: Obstetrics & Gynecology

## 2023-04-13 MED ORDER — LEVOTHYROXINE SODIUM 100 MCG OR TABS
100.0000 ug | ORAL_TABLET | Freq: Every day | ORAL | 3 refills | Status: AC
Start: 2023-04-13 — End: ?

## 2023-04-16 NOTE — Progress Notes (Signed)
 TOC    HPI: Meagan Coffey is a 33 y.o. G4P0030 here for transfer of care appt.  LMP 11/20/22, regular cycles, sure date.  Patient has been seen by [place], Dr. Ronni Rumble.    No ctx/bleeding. + round ligament pain.  + FM.  No pre-E sympt.  Taking PNV.    Patient did have an ectopic preg treated in 2010 surgically.  Patient also had uterine septum resection in 2021.    Pregnancy was complicated by hypothyroidism due to Hashimoto's thyroiditis and depression/ADHD.  She has stopped all meds.    Patient is also taking vitamin D3, magnesium, synthroid 100 mcg daily, iron, vitamin B12.    Patient has felt some dizziness in the last 3-4 weeks, morphed into nausea.    Patient also has chronic hives (since age 48)- seem to be stress related, cold, hot, pressure.  It has been good since pregnancy started.      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    Patient has been married since 2019.      Review of Dates:  LMP 11/20/22 --> EDD 08/27/23   Ultrasound 01/08/23 ---> EDD 08/29/23 --> EGA [redacted]w[redacted]d   Final EDD 08/27/23     08/14/21: HPV neg.      02/12/23: NT testing- CRL 12.4 weeks.  NT 1.3 mm.  Small midline fibroid anterior- 1 x 0.7 x .8 cm.    02/12/23:  CF testing neg.  Hgb electrophoresis neg.  cfDNA neg.  TSH 2.644  H/H 13/39.      01/08/23: GC/chlam neg.  Urine culture neg.      PAST MEDICAL HISTORY  Past Medical History:   Diagnosis Date   . Hives    . PONV (postoperative nausea and vomiting)    . Thyroid disease         PAST SURGICAL HISTORY  Past Surgical History:   Procedure Laterality Date   . ANKLE FRACTURE SURGERY Left 2014   . TONSILLECTOMY Bilateral 02/04/2018        HOME MEDICATIONS  Prior to Admission medications    Medication Sig Start Date End Date Taking? Authorizing Provider   famotidine (PEPCID) 20 MG tablet Take 1 tablet (20 mg total) by mouth 2 times a day 08/07/18 08/07/19  Cathie Olden, MD   levothyroxine (SYNTHROID) 50 MCG tablet Take 50 mcg by mouth daily    Provider, Historical    ondansetron (ZOFRAN) 4 MG Tablet Take 2 tablets (8 mg total) by mouth every 8 hours as needed for nausea 02/07/18   Tresa Endo, MD        ALLERGIES  ALLERGIES   Allergen Reactions   . Amoxicillin Rash   . Pcn [penicillins] Rash        OBSTETRIC HISTORY  OB History   Gravida Para Term Preterm AB Living   2             SAB IAB Ectopic Multiple Live Births                  # Outcome Date GA Lbr Len/2nd Weight Sex Type Anes PTL Lv   2 Current            1 Gravida                SOCIAL HISTORY  Social History     Socioeconomic History   . Marital status: Married     Spouse name: Not on file   . Number  of children: Not on file   . Years of education: Not on file   . Highest education level: Not on file   Occupational History   . Not on file   Tobacco Use   . Smoking status: Former     Current packs/day: 0.00     Types: Cigarettes     Quit date: 03/2012     Years since quitting: 11.1   . Smokeless tobacco: Never   Substance and Sexual Activity   . Alcohol use: Not Currently   . Drug use: Yes     Types: Marijuana   . Sexual activity: Not on file   Other Topics Concern   . Not on file   Social History Narrative   . Not on file     Social Drivers of Health     Financial Resource Strain: Not on file   Food Insecurity: Not on file   Transportation Needs: Not on file   Physical Activity: Not on file   Stress: Not on file   Social Connections: Not on file   Intimate Partner Violence: Not on file   Housing Stability: Not on file       FAMILY HISTORY  No family history on file.     REVIEW OF SYSTEMS  General, ENT, respiratory, cardiovascular, gastrointestinal, genito-urinary, musculoskeletal, neurological, dermatologic, psychiatric and endocrine systems negative except for HPI    PHYSICAL EXAM  Vitals:    04/16/23 1002   BP: 117/63   Pulse: 87   Resp: 16   Temp: 97.9 F (36.6 C)       Gen:  AAO x 3.  NAD.  Well nourished.    Resp:  Normal respiratory effort  Psych:  Normal mood, affect, judgement  Neuro:  CN II-XII grossly  intact      ASSESSMENT AND PLAN  33 y.o. G4P0030 here for initial OB appt.  - will keep EDD from LMP of 08/27/23.    -  1 hr GTT/CBC/RPR/TSH/free T4 at [facility].    - remainder of OB labs today.    - Hashimoto's thyroiditis   - continue taking synthroid 100 mcg daily.   - plan to check TFT q trimester.     -Prenatal care  -Genetic screening:  cfDNA neg.  (From [place]).  CF testing neg.  Too late for AFP.    -Anatomy US: referral to MFM for level 2 sono- referral today.    - 1h GTT:  24-28 wks- at [facility].    - Tdap vaccine: 3rd trimester.   -Flu vaccine: given on 12/17/22  -COVID-19 vaccine: vaccine x 5  -GBS at 36w   -Continue prenatal vitamins  - Follow up in 4 weeks for OB check.

## 2023-04-30 ENCOUNTER — Encounter (HOSPITAL_BASED_OUTPATIENT_CLINIC_OR_DEPARTMENT_OTHER)

## 2023-04-30 DIAGNOSIS — Z3A23 23 weeks gestation of pregnancy: Secondary | ICD-10-CM

## 2023-04-30 DIAGNOSIS — E66812 Obesity, class 2: Secondary | ICD-10-CM

## 2023-04-30 DIAGNOSIS — O99212 Obesity complicating pregnancy, second trimester: Secondary | ICD-10-CM

## 2023-04-30 DIAGNOSIS — Z6836 Body mass index (BMI) 36.0-36.9, adult: Secondary | ICD-10-CM

## 2023-04-30 NOTE — Progress Notes (Signed)
 Spoke with [place] about AFP screening. Reviewed ultrasound will typically identify >90% of open neural tube defects while maternal serum AFP screening will identify 80%. Reviewed false positive rate of 1%. Discussed follow up recommendation of amniocentesis to evaluated amniotic fluid AFP in the case that maternal serum AFP is elevated. The couple stated understanding. The patient elected to proceed with testing. Results will be available in MyChart within 1 week. Follow up with genetic counseling if abnormal.

## 2023-04-30 NOTE — Progress Notes (Signed)
 MFM ultrasound completed; please see separate report.

## 2023-05-06 ENCOUNTER — Ambulatory Visit (HOSPITAL_COMMUNITY): Payer: BLUE CROSS/BLUE SHIELD

## 2023-09-04 ENCOUNTER — Inpatient Hospital Stay: Payer: Self-pay

## 2023-09-08 NOTE — Discharge Summary (Addendum)
 OB Discharge Summary    Date of admission: 09/04/2023  Date of discharge: 09/08/2023    Attending Provider: Luci Foster HERO, MD  Delivering Provider: Gladis Sor MD     Admission Diagnosis:  1) IUP at [redacted]w[redacted]d  2) Scheduled IOL for postdates  3) RH negative  4) Hypothyroid  5) Recurrent pregnancy loss    Discharge Diagnosis:   1) s/p LTCS   2) RH negative, baby also RH negative so pt did NOT receive Rhogam  3) Acute blood loss anemia, treated with Hgb monitoring and IV iron    Patient Active Problem List    Diagnosis Date Noted   . Status post primary low transverse cesarean section 09/06/2023   . Encounter for induction of labor ([facility]) 09/04/2023   . Elevated blood pressure reading in office without diagnosis of hypertension 09/02/2023   . Diarrhea during pregnancy ([facility]) 07/22/2023   . Rh negative state in antepartum period ([facility]) 07/22/2023   . Hypothyroidism affecting pregnancy ([facility]) 04/16/2023   . Sleep-disordered breathing 01/14/2018         Hospital Course:  33 y/o [id number] at 55 3/7 weeks who was admitted for postdates induction. After cervical ripening with misoprostol, cervical balloon, pitocin and arom, she made no appreciable cervical change beyond 4cm for 24+ hours. Primary LTCS done for failed IOL, baby found to be OP and asynclitic. Delivery notable for thrombosed umbilical vein, extending out ~8-10 inches from umbilicus. Umbilical vein engorged and friable in this section.     QBL 1,160 mL. Hgb 11.7-> 11.1-> 9.3, received one dose of IV iron yesterday.    Postoperatively her pain was controlled with Astromorph in her spinal for the first 12 hours.  She was then transitioned to oral pain medications.  A Foley catheter was kept to gravity drainage and removed 8-12 hours after surgery.  Her diet was advanced.  On the day of discharge, the patient had adequate pain control with oral medications, she was tolerating a general diet, she was passing flatus, voiding without difficulty, and  ambulating without assistance.       Consults: none    Medications:  Current Discharge Medication List        START taking these medications    Details   acetaminophen (TYLENOL EXTRA STRENGTH) 500 MG tablet Take 2 tablets (1,000 mg total) by mouth every 6 hours as needed for pain  Qty: 60 tablet, Refills: 0      ibuprofen (MOTRIN) 600 MG tablet Take 1 tablet (600 mg total) by mouth every 6 hours as needed for pain  Qty: 60 tablet, Refills: 0      oxyCODONE (ROXICODONE) 5 MG immediate release tablet Take 1 tablet (5 mg total) by mouth every 4 hours as needed for pain  Qty: 12 tablet, Refills: 0      polyethylene glycol (GLYCOLAX) 17 gram/dose powder Take 17 g by mouth daily dissolved in 4 to 8 ounces of beverage  Qty: 238 g, Refills: 0      sennosides (SENOKOT) 8.6 mg tablet Take 2 tablets (17.2 mg total) by mouth daily as needed for constipation  Qty: 14 tablet, Refills: 0           CONTINUE these medications which have NOT CHANGED    Details   FERROUS SULFATE ORAL Take 200 mg by mouth      levothyroxine  (SYNTHROID ) 100 MCG tablet Take 100 mcg by mouth daily      MAGNESIUM GLYCINATE ORAL Take by mouth  PNV Comb 13/Iron Cb/FA/DSS/DHA (PRENATAL 13-IRON-FA-DSS-DHA ORAL) Take by mouth              Immunizations:  Immunization History   Administered Date(s) Administered   . Flu Vaccine, 18-64 yrs, Non-Egg 12/17/2022   . Tdap 06/19/2023       Objective:  Vitals:    09/07/23 0900 09/07/23 1645 09/07/23 1946 09/08/23 0321   BP: 119/74 109/62 109/71 102/60   MAP (mmHg): 89 78 80 71   Pulse: 92 79 68 71   Resp: 18 14 18 20    Temp: 97.9 F (36.6 C) 98.1 F (36.7 C) 98.3 F (36.8 C) 98.2 F (36.8 C)   SpO2: 99% 98% 100% 99%       Gen: AAO x 3  Skin: normal color, texture, turgor, no rashes/bruises/active lesions  Heart: regular rate  Lungs: clear, unlabored breathing  Breasts: WNL  Abdomen: soft, non-distended, incision open to air and C/D/I. Steri strips intact.  Fundus: FF-2u  Lochia: scant  Extr: no edema    Labs:  Labs Reviewed    Hgb 11.7-> 11.1-> 9.3    Assessment:   POD 2  Acute blood loss anemia, s/p Venofer  Normal recovery  Breastfeeding      Plan    Disposition: home  Discharge Condition: good  Activity: no sex for 6 weeks, no lifting for 6 weeks and no driving while on narcotics  Diet: general diet  Wound Care: keep wound clean and dry, incision check in 1 week  Birth control: Copper IUD  Follow Up: with provider in 1-2 weeks for a wound check, then 6 weeks for routine postpartum check, sooner PRN.    Donald Gold, ARNP, CNM  09/08/2023 9:08 AM

## 2023-09-09 ENCOUNTER — Encounter (INDEPENDENT_AMBULATORY_CARE_PROVIDER_SITE_OTHER): Payer: Self-pay

## 2023-09-09 ENCOUNTER — Emergency Department: Payer: Self-pay

## 2023-09-09 NOTE — Progress Notes (Signed)
 TCM Note    Patient reviewed for TCM follow-up post inpatient discharge. Patient admitted to [facility] on 09/04/23 for C-section . Deferring TCM outreach for following reason: OB and Surgery

## 2023-09-11 ENCOUNTER — Encounter (INDEPENDENT_AMBULATORY_CARE_PROVIDER_SITE_OTHER): Payer: Self-pay

## 2024-01-04 ENCOUNTER — Encounter (INDEPENDENT_AMBULATORY_CARE_PROVIDER_SITE_OTHER): Payer: Self-pay | Admitting: Family Medicine

## 8278-08-01 DEATH — deceased
